# Patient Record
Sex: Female | Born: 1975
Health system: Southern US, Community
[De-identification: ages and names within clinical notes are randomized; demographics above are authoritative.]

## PROBLEM LIST (undated history)

## (undated) DIAGNOSIS — E559 Vitamin D deficiency, unspecified: Secondary | ICD-10-CM

## (undated) DIAGNOSIS — N390 Urinary tract infection, site not specified: Secondary | ICD-10-CM

## (undated) DIAGNOSIS — O039 Complete or unspecified spontaneous abortion without complication: Secondary | ICD-10-CM

## (undated) DIAGNOSIS — D649 Anemia, unspecified: Secondary | ICD-10-CM

## (undated) HISTORY — DX: Urinary tract infection, site not specified: N39.0

## (undated) HISTORY — DX: Complete or unspecified spontaneous abortion without complication: O03.9

## (undated) HISTORY — DX: Anemia, unspecified: D64.9

## (undated) HISTORY — DX: Vitamin D deficiency, unspecified: E55.9

## (undated) HISTORY — PX: TONSILLECTOMY AND ADENOIDECTOMY: SHX28

---

## 2009-04-22 ENCOUNTER — Ambulatory Visit: Payer: Self-pay

## 2009-04-23 ENCOUNTER — Inpatient Hospital Stay: Payer: Self-pay | Admitting: Obstetrics & Gynecology

## 2015-04-15 ENCOUNTER — Emergency Department: Payer: BLUE CROSS/BLUE SHIELD

## 2015-04-15 ENCOUNTER — Emergency Department
Admission: EM | Admit: 2015-04-15 | Discharge: 2015-04-15 | Disposition: A | Discharge status: Home / Self Care | Payer: BLUE CROSS/BLUE SHIELD | Attending: Emergency Medicine | Admitting: Emergency Medicine

## 2015-04-15 ENCOUNTER — Encounter: Payer: Self-pay | Admitting: Emergency Medicine

## 2015-04-15 DIAGNOSIS — Z3A01 Less than 8 weeks gestation of pregnancy: Secondary | ICD-10-CM | POA: Diagnosis not present

## 2015-04-15 DIAGNOSIS — O200 Threatened abortion: Secondary | ICD-10-CM

## 2015-04-15 DIAGNOSIS — Z79899 Other long term (current) drug therapy: Secondary | ICD-10-CM | POA: Insufficient documentation

## 2015-04-15 DIAGNOSIS — O209 Hemorrhage in early pregnancy, unspecified: Secondary | ICD-10-CM | POA: Diagnosis present

## 2015-04-15 LAB — URINALYSIS COMPLETE WITH MICROSCOPIC (ARMC ONLY)
BILIRUBIN URINE: NEGATIVE
Glucose, UA: NEGATIVE mg/dL
Ketones, ur: NEGATIVE mg/dL
Nitrite: NEGATIVE
PROTEIN: 30 mg/dL — AB
Specific Gravity, Urine: 1.015 (ref 1.005–1.030)
pH: 8 (ref 5.0–8.0)

## 2015-04-15 LAB — CBC WITH DIFFERENTIAL/PLATELET
Basophils Absolute: 0.1 10*3/uL (ref 0–0.1)
Basophils Relative: 0 %
Eosinophils Absolute: 0 10*3/uL (ref 0–0.7)
Eosinophils Relative: 0 %
HEMATOCRIT: 37.6 % (ref 35.0–47.0)
HEMOGLOBIN: 12.3 g/dL (ref 12.0–16.0)
LYMPHS ABS: 1.5 10*3/uL (ref 1.0–3.6)
LYMPHS PCT: 12 %
MCH: 28.4 pg (ref 26.0–34.0)
MCHC: 32.8 g/dL (ref 32.0–36.0)
MCV: 86.5 fL (ref 80.0–100.0)
Monocytes Absolute: 0.5 10*3/uL (ref 0.2–0.9)
Monocytes Relative: 4 %
NEUTROS ABS: 10.1 10*3/uL — AB (ref 1.4–6.5)
NEUTROS PCT: 84 %
Platelets: 402 10*3/uL (ref 150–440)
RBC: 4.35 MIL/uL (ref 3.80–5.20)
RDW: 15.3 % — ABNORMAL HIGH (ref 11.5–14.5)
WBC: 12.2 10*3/uL — AB (ref 3.6–11.0)

## 2015-04-15 LAB — COMPREHENSIVE METABOLIC PANEL
ALK PHOS: 45 U/L (ref 38–126)
ALT: 10 U/L — AB (ref 14–54)
AST: 16 U/L (ref 15–41)
Albumin: 4 g/dL (ref 3.5–5.0)
Anion gap: 5 (ref 5–15)
BUN: 7 mg/dL (ref 6–20)
CALCIUM: 9.2 mg/dL (ref 8.9–10.3)
CO2: 28 mmol/L (ref 22–32)
CREATININE: 0.71 mg/dL (ref 0.44–1.00)
Chloride: 108 mmol/L (ref 101–111)
Glucose, Bld: 100 mg/dL — ABNORMAL HIGH (ref 65–99)
Potassium: 3.7 mmol/L (ref 3.5–5.1)
Sodium: 141 mmol/L (ref 135–145)
Total Bilirubin: 0.5 mg/dL (ref 0.3–1.2)
Total Protein: 7.4 g/dL (ref 6.5–8.1)

## 2015-04-15 LAB — ABO/RH: ABO/RH(D): A POS

## 2015-04-15 LAB — HCG, QUANTITATIVE, PREGNANCY: HCG, BETA CHAIN, QUANT, S: 1737 m[IU]/mL — AB (ref <5)

## 2015-04-15 NOTE — ED Notes (Signed)
Pt states she just found out she was pregnant.  LMP 02/25/15.  Today having vaginal bleeding

## 2015-04-15 NOTE — ED Provider Notes (Signed)
[facility] Emergency Department Provider Note  ____________________________________________  Time seen: 1420  I have reviewed the triage vital signs and the nursing notes.   HISTORY  Chief Complaint Vaginal Bleeding  pregnant    HPI Kathy Hicks is a 39 y.o. female, now G2 P1,  whose last menstrual period was in the middle of August. She found out she was pregnant this past Thursday. This past Saturday she began to have vaginal bleeding similar to a menstrual period. She went to go see her gynecologist obstetrician this morning, Dr. Vergie Living at [facility]. Dr. Vergie Living did a pelvic exam but apparently was unable to perform an ultrasound. He sent the patient to the hospital for further evaluation and ultrasound.  The patient reports that she has some mild cramping similar to a menstrual period. She has no discomfort currently. She has no nausea or vomiting. The bleeding is a little bit less today but overall has been similar to her usual menstrual flow.     History reviewed. No pertinent past medical history.  There are no active problems to display for this patient.   History reviewed. No pertinent past surgical history.  Current Outpatient Rx  Name  Route  Sig  Dispense  Refill  . Prenatal Vit-Fe Fumarate-FA (PRENATAL MULTIVITAMIN) TABS tablet   Oral   Take 1 tablet by mouth daily at 12 noon.           Allergies Review of patient's allergies indicates no known allergies.  History reviewed. No pertinent family history.  Social History Social History  Substance Use Topics  . Smoking status: Never Smoker   . Smokeless tobacco: None  . Alcohol Use: None    Review of Systems  Constitutional: Negative for fever. ENT: Negative for sore throat. Cardiovascular: Negative for chest pain. Respiratory: Negative for shortness of breath. Gastrointestinal: Negative for abdominal pain, vomiting and diarrhea. Genitourinary: Vaginal bleeding,  pregnant. See history of present illness. Musculoskeletal: No myalgias or injuries. Skin: Negative for rash. Neurological: Negative for headaches   10-point ROS otherwise negative.  ____________________________________________   PHYSICAL EXAM:  VITAL SIGNS: ED Triage Vitals  Enc Vitals Group     BP 04/15/15 1125 122/62 mmHg     Pulse Rate 04/15/15 1125 94     Resp 04/15/15 1125 18     Temp 04/15/15 1125 98.7 F (37.1 C)     Temp Source 04/15/15 1125 Oral     SpO2 04/15/15 1125 100 %     Weight 04/15/15 1106 145 lb (65.772 kg)     Height 04/15/15 1106  (1.6 m)     Head Cir --      Peak Flow --      Pain Score 04/15/15 1107 2     Pain Loc --      Pain Edu? --      Excl. in GC? --     Constitutional:  Alert and oriented. Well appearing and in no distress. ENT   Head: Normocephalic and atraumatic. Cardiovascular: Normal rate, regular rhythm, no murmur noted Respiratory:  Normal respiratory effort, no tachypnea.    Breath sounds are clear and equal bilaterally.  Gastrointestinal: Soft and nontender. No distention. Pelvic: Deferred as the patient just underwent a pelvic by her gynecologist this morning  Back: No muscle spasm, no tenderness, no CVA tenderness. Musculoskeletal: No deformity noted. Nontender with normal range of motion in all extremities.  No noted edema. Neurologic:  Normal speech and language. No gross focal neurologic  deficits are appreciated.  Skin:  Skin is warm, dry. No rash noted. Psychiatric: Mood and affect are normal. Speech and behavior are normal.  ____________________________________________    LABS (pertinent positives/negatives)  Labs Reviewed  CBC WITH DIFFERENTIAL/PLATELET - Abnormal; Notable for the following:    WBC 12.2 (*)    RDW 15.3 (*)    Neutro Abs 10.1 (*)    All other components within normal limits  COMPREHENSIVE METABOLIC PANEL - Abnormal; Notable for the following:    Glucose, Bld 100 (*)    ALT 10 (*)    All  other components within normal limits  HCG, QUANTITATIVE, PREGNANCY - Abnormal; Notable for the following:    hCG, Beta Chain, Quant, S 1737 (*)    All other components within normal limits  URINALYSIS COMPLETEWITH MICROSCOPIC ([facility] ONLY) - Abnormal; Notable for the following:    Color, Urine YELLOW (*)    APPearance HAZY (*)    Hgb urine dipstick 3+ (*)    Protein, ur 30 (*)    Leukocytes, UA 1+ (*)    Bacteria, UA RARE (*)    Squamous Epithelial / LPF 0-5 (*)    All other components within normal limits  ABO/RH     ____________________________________________    RADIOLOGY  Pelvic ultrasound: IMPRESSION: 1. Sac-like collection within the uterus, along the endometrial complex, most suggestive of a gestational sac of early intrauterine pregnancy. By sac size, estimated gestational age is 5 weeks and 4 days. No yolk sac or fetal pole identified as yet. Recommend follow-up with serial beta HCG levels and obstetric ultrasound as needed to ensure normal pregnancy. 2. 4 cm hypoechoic mass along the posterior margin of the uterus, most suggestive of an exophytic fibroid. 3. Maternal ovaries appear normal and there is no mass or free fluid identified within either adnexal region.  ____________________________________________   INITIAL IMPRESSION / ASSESSMENT AND PLAN / ED COURSE  Pertinent labs & imaging results that were available during my care of the patient were reviewed by me and considered in my medical decision making (see chart for details).   Well-appearing, pleasant, 39 year old female, accompanied by her husband. We have spoken about the differential diagnosis of her current condition, which includes threatened miscarriage versus continuing regnancy. We will obtain an ultrasound. I have added on a type and Rh factor.  ----------------------------------------- 4:53 PM on 04/15/2015 -----------------------------------------  Ultrasound shows a saclike collection  consistent with a gestational age of [redacted] weeks and 4 days.  Blood type is A-positive  The patient appears comfortable and in no acute distress.  We will discharge the patient to follow up with Dr. Vergie Living at [facility] side OB[facility]. ____________________________________________   FINAL CLINICAL IMPRESSION(S) / ED DIAGNOSES  Final diagnoses:  Threatened miscarriage in early pregnancy      Darien Ramus, MD 04/15/15 1655

## 2015-04-15 NOTE — Discharge Instructions (Signed)

## 2016-04-24 DIAGNOSIS — R8290 Unspecified abnormal findings in urine: Secondary | ICD-10-CM | POA: Diagnosis not present

## 2016-12-14 ENCOUNTER — Telehealth: Payer: Self-pay

## 2016-12-14 NOTE — Telephone Encounter (Signed)
Pt calling - has forgotten what dose of Vit D she is supposed to take.  Is 1000 IU or 2000 IU?  [phone]

## 2016-12-14 NOTE — Telephone Encounter (Signed)
Vitamin D3 2000 IU total daily. RN to notify pt.

## 2016-12-14 NOTE — Telephone Encounter (Signed)
Pt aware.

## 2017-05-17 ENCOUNTER — Telehealth: Payer: Self-pay

## 2017-05-17 NOTE — Telephone Encounter (Signed)
Pt calling, thinks she has a UTI, has rx for UTI that she didn't take a year ago b/c what she had was a yeast inf.  Wants to know if that is okay to take.  [phone]  Pt's sxs are frequency and strong odor.  Adv to go ahead and take medication and if it doesn't help to call to be seen.

## 2017-10-04 ENCOUNTER — Ambulatory Visit: Payer: Self-pay | Admitting: Obstetrics and Gynecology

## 2017-10-18 ENCOUNTER — Ambulatory Visit (INDEPENDENT_AMBULATORY_CARE_PROVIDER_SITE_OTHER): Payer: BLUE CROSS/BLUE SHIELD | Admitting: Obstetrics and Gynecology

## 2017-10-18 ENCOUNTER — Other Ambulatory Visit: Payer: Self-pay

## 2017-10-18 ENCOUNTER — Encounter: Payer: Self-pay | Admitting: Obstetrics and Gynecology

## 2017-10-18 VITALS — BP 116/76 | HR 80 | Ht 61.0 in | Wt 131.0 lb

## 2017-10-18 DIAGNOSIS — Z1151 Encounter for screening for human papillomavirus (HPV): Secondary | ICD-10-CM | POA: Diagnosis not present

## 2017-10-18 DIAGNOSIS — Z124 Encounter for screening for malignant neoplasm of cervix: Secondary | ICD-10-CM | POA: Diagnosis not present

## 2017-10-18 DIAGNOSIS — Z01419 Encounter for gynecological examination (general) (routine) without abnormal findings: Secondary | ICD-10-CM | POA: Diagnosis not present

## 2017-10-18 DIAGNOSIS — N841 Polyp of cervix uteri: Secondary | ICD-10-CM | POA: Insufficient documentation

## 2017-10-18 DIAGNOSIS — Z760 Encounter for issue of repeat prescription: Secondary | ICD-10-CM

## 2017-10-18 DIAGNOSIS — Z1231 Encounter for screening mammogram for malignant neoplasm of breast: Secondary | ICD-10-CM | POA: Diagnosis not present

## 2017-10-18 DIAGNOSIS — N390 Urinary tract infection, site not specified: Secondary | ICD-10-CM | POA: Insufficient documentation

## 2017-10-18 DIAGNOSIS — Z1239 Encounter for other screening for malignant neoplasm of breast: Secondary | ICD-10-CM

## 2017-10-18 MED ORDER — NITROFURANTOIN MONOHYD MACRO 100 MG PO CAPS
ORAL_CAPSULE | ORAL | 1 refills | Status: DC
Start: 2017-10-18 — End: 2019-04-05

## 2017-10-18 NOTE — Progress Notes (Signed)
PCP:  Patient, No Pcp Per   Chief Complaint  Patient presents with  . Gynecologic Exam    No complaints     HPI:      [name] is a 42 y.o. G1P0 who LMP was Patient's last menstrual period was 10/02/2017., presents today for her annual examination.  Her menses are regular every 19-28 days, lasting 6 days.  Dysmenorrhea mild, occurring first 1-2 days of flow. She does not have intermenstrual bleeding.  Sex activity: single partner, contraception - none. Conception ok. Had SAB 2016. No postcoital bleeding. Last Pap: August 15, 2013  Results were: no abnormalities /neg HPV DNA  Hx of STDs: none  Last mammogram: not within yr. There is no FH of breast cancer. There is no FH of ovarian cancer. The patient does do self-breast exams.  Tobacco use: The patient denies current or previous tobacco use. Alcohol use: none No drug use.  Exercise: moderately active  She does get adequate calcium and Vitamin D in her diet. She takes macrobid BID x 7 days prn UTI sx, triggered by sex. Rx had been written to take once after sex as preventive. Needs Rx RF.  Past Medical History:  Diagnosis Date  . Anemia   . SAB (spontaneous abortion)   . UTI (urinary tract infection)   . Vitamin D deficiency     Past Surgical History:  Procedure Laterality Date  . NO PAST SURGERIES      Family History  Problem Relation Age of Onset  . Uterine cancer Mother 62  . Hypertension Mother   . Hyperlipidemia Mother   . Lung cancer Father 34    Social History   Socioeconomic History  . Marital status: Married    Spouse name: Not on file  . Number of children: Not on file  . Years of education: Not on file  . Highest education level: Not on file  Occupational History  . Not on file  Social Needs  . Financial resource strain: Not on file  . Food insecurity:    Worry: Not on file    Inability: Not on file  . Transportation needs:    Medical: Not on file    Non-medical: Not on file   Tobacco Use  . Smoking status: Never Smoker  . Smokeless tobacco: Never Used  Substance and Sexual Activity  . Alcohol use: Not Currently  . Drug use: Never  . Sexual activity: Yes    Birth control/protection: None  Lifestyle  . Physical activity:    Days per week: 7 days    Minutes per session: 30 min  . Stress: Not on file  Relationships  . Social connections:    Talks on phone: Not on file    Gets together: Not on file    Attends religious service: Not on file    Active member of club or organization: Not on file    Attends meetings of clubs or organizations: Not on file    Relationship status: Not on file  . Intimate partner violence:    Fear of current or ex partner: Not on file    Emotionally abused: Not on file    Physically abused: Not on file    Forced sexual activity: Not on file  Other Topics Concern  . Not on file  Social History Narrative  . Not on file    Outpatient Medications Prior to Visit  Medication Sig Dispense Refill  . Prenatal Vit-Fe Fumarate-FA (PRENATAL MULTIVITAMIN)  TABS tablet Take 1 tablet by mouth daily at 12 noon.     No facility-administered medications prior to visit.      ROS:  Review of Systems  Constitutional: Negative for fatigue, fever and unexpected weight change.  Respiratory: Negative for cough, shortness of breath and wheezing.   Cardiovascular: Negative for chest pain, palpitations and leg swelling.  Gastrointestinal: Negative for blood in stool, constipation, diarrhea, nausea and vomiting.  Endocrine: Negative for cold intolerance, heat intolerance and polyuria.  Genitourinary: Negative for dyspareunia, dysuria, flank pain, frequency, genital sores, hematuria, menstrual problem, pelvic pain, urgency, vaginal bleeding, vaginal discharge and vaginal pain.  Musculoskeletal: Negative for back pain, joint swelling and myalgias.  Skin: Negative for rash.  Neurological: Negative for dizziness, syncope, light-headedness, numbness  and headaches.  Hematological: Negative for adenopathy.  Psychiatric/Behavioral: Negative for agitation, confusion, sleep disturbance and suicidal ideas. The patient is not nervous/anxious.    BREAST: No symptoms   Objective: BP 116/76 (BP Location: Left Arm, Patient Position: Sitting, Cuff Size: Normal)   Pulse 80   Ht 5\' 1"  (1.549 m)   Wt 131 lb (59.4 kg)   LMP 10/02/2017   Breastfeeding? Unknown   BMI 24.75 kg/m    Physical Exam  Constitutional: She is oriented to person, place, and time. She appears well-developed and well-nourished.  Genitourinary: Vagina normal and uterus normal. There is no rash or tenderness on the right labia. There is no rash or tenderness on the left labia. No erythema or tenderness in the vagina. No vaginal discharge found. Right adnexum does not display mass and does not display tenderness. Left adnexum does not display mass and does not display tenderness.  Cervix exhibits polyp. Cervix does not exhibit motion tenderness. Uterus is not enlarged or tender.  Neck: Normal range of motion. No thyromegaly present.  Cardiovascular: Normal rate, regular rhythm and normal heart sounds.  No murmur heard. Pulmonary/Chest: Effort normal and breath sounds normal. Right breast exhibits no mass, no nipple discharge, no skin change and no tenderness. Left breast exhibits no mass, no nipple discharge, no skin change and no tenderness.  Abdominal: Soft. There is no tenderness. There is no guarding.  Musculoskeletal: Normal range of motion.  Neurological: She is alert and oriented to person, place, and time. No cranial nerve deficit.  Psychiatric: She has a normal mood and affect. Her behavior is normal.  Vitals reviewed.    ENDOCX POLYP REMOVAL Cervix visualized and polyp noted.  Ring forcep applied to cervix and twisting motion removed polyp intact.  Hemostasis obtained.  Assessment/Plan: Encounter for annual routine gynecological examination  Cervical cancer  screening - Plan: IGP, Aptima HPV  Screening for HPV (human papillomavirus) - Plan: IGP, Aptima HPV  Screening for breast cancer - Pt to sched mammo. - Plan: MM DIGITAL SCREENING BILATERAL  Endocervical polyp - Removed, sent to path. Will f/u if abn. Pt without sx. - Plan: Pathology  Prescription refill - Rx RF macrobid to take after sex as preventive.  - Plan: nitrofurantoin, macrocrystal-monohydrate, (MACROBID) 100 MG capsule  Meds ordered this encounter  Medications  . nitrofurantoin, macrocrystal-monohydrate, (MACROBID) 100 MG capsule    Sig: Take 1 capsule after sex as preventive    Dispense:  30 capsule    Refill:  1    Order Specific Question:   Supervising Provider    Answer:   Nadara Mustard [[id number]]             GYN counsel breast self exam, mammography screening, adequate  intake of calcium and vitamin D, diet and exercise     F/U  Return in about 1 year (around 10/19/2018).  Sherria Riemann B. Nyriah Coote, PA-C 10/18/2017 10:47 AM

## 2017-10-18 NOTE — Patient Instructions (Addendum)
I value your feedback and entrusting us with your care. If you get a [facility] patient survey, I would appreciate you taking the time to let us know about your experience today. Thank you!  [facility]: [phone]   [facility]: [phone]  

## 2017-10-20 LAB — PATHOLOGY

## 2017-10-21 LAB — IGP, APTIMA HPV
HPV APTIMA: NEGATIVE
PAP Smear Comment: 0

## 2017-12-13 DIAGNOSIS — J0140 Acute pansinusitis, unspecified: Secondary | ICD-10-CM | POA: Diagnosis not present

## 2017-12-13 DIAGNOSIS — J209 Acute bronchitis, unspecified: Secondary | ICD-10-CM | POA: Diagnosis not present

## 2018-05-24 ENCOUNTER — Other Ambulatory Visit: Payer: Self-pay | Admitting: Obstetrics and Gynecology

## 2018-05-24 DIAGNOSIS — Z1231 Encounter for screening mammogram for malignant neoplasm of breast: Secondary | ICD-10-CM

## 2018-06-01 ENCOUNTER — Ambulatory Visit
Admission: RE | Admit: 2018-06-01 | Discharge: 2018-06-01 | Disposition: A | Discharge status: Home / Self Care | Payer: BLUE CROSS/BLUE SHIELD | Source: Ambulatory Visit | Attending: Obstetrics and Gynecology | Admitting: Obstetrics and Gynecology

## 2018-06-01 ENCOUNTER — Encounter: Payer: Self-pay | Admitting: Obstetrics and Gynecology

## 2018-06-01 DIAGNOSIS — Z1231 Encounter for screening mammogram for malignant neoplasm of breast: Secondary | ICD-10-CM | POA: Diagnosis not present

## 2018-07-22 ENCOUNTER — Ambulatory Visit: Payer: BLUE CROSS/BLUE SHIELD | Admitting: Maternal Newborn

## 2019-03-09 ENCOUNTER — Telehealth: Payer: Self-pay

## 2019-03-09 DIAGNOSIS — Z0000 Encounter for general adult medical examination without abnormal findings: Secondary | ICD-10-CM

## 2019-03-09 DIAGNOSIS — Z13220 Encounter for screening for lipoid disorders: Secondary | ICD-10-CM

## 2019-03-09 NOTE — Telephone Encounter (Signed)
Pt calling; needs to schedule appt for labs; eye doctor saw cholesterol buildup on her eyes 86m ago.  She sched her annual for 04/05/19 at 3pm.  Can labs be done that morning?   She knows she will need to fast the night before.  [phone]

## 2019-03-09 NOTE — Telephone Encounter (Signed)
Patient is schedule for 03/30/19 for LABS

## 2019-03-09 NOTE — Telephone Encounter (Signed)
Lab orders placed. Pls call pt to sched lab appt. She can do as early as tomorrow if she doesn't want to wait for annual appt. Thx.

## 2019-03-09 NOTE — Telephone Encounter (Signed)
Please advise 

## 2019-03-30 ENCOUNTER — Other Ambulatory Visit: Payer: Self-pay

## 2019-03-30 ENCOUNTER — Other Ambulatory Visit: Payer: BC Managed Care – PPO

## 2019-03-30 DIAGNOSIS — Z13220 Encounter for screening for lipoid disorders: Secondary | ICD-10-CM | POA: Diagnosis not present

## 2019-03-30 DIAGNOSIS — Z0000 Encounter for general adult medical examination without abnormal findings: Secondary | ICD-10-CM

## 2019-03-31 LAB — COMPREHENSIVE METABOLIC PANEL
ALT: 7 IU/L (ref 0–32)
AST: 12 IU/L (ref 0–40)
Albumin/Globulin Ratio: 2 (ref 1.2–2.2)
Albumin: 4.3 g/dL (ref 3.8–4.8)
Alkaline Phosphatase: 48 IU/L (ref 39–117)
BUN/Creatinine Ratio: 18 (ref 9–23)
BUN: 14 mg/dL (ref 6–24)
Bilirubin Total: <0.2 mg/dL (ref 0.0–1.2)
CO2: 24 mmol/L (ref 20–29)
Calcium: 9.5 mg/dL (ref 8.7–10.2)
Chloride: 103 mmol/L (ref 96–106)
Creatinine, Ser: 0.78 mg/dL (ref 0.57–1.00)
GFR calc Af Amer: 107 mL/min/{1.73_m2} (ref >59)
GFR calc non Af Amer: 93 mL/min/{1.73_m2} (ref >59)
Globulin, Total: 2.2 g/dL (ref 1.5–4.5)
Glucose: 81 mg/dL (ref 65–99)
Potassium: 4.2 mmol/L (ref 3.5–5.2)
Sodium: 141 mmol/L (ref 134–144)
Total Protein: 6.5 g/dL (ref 6.0–8.5)

## 2019-03-31 LAB — LIPID PANEL
Chol/HDL Ratio: 2.5 ratio (ref 0.0–4.4)
Cholesterol, Total: 189 mg/dL (ref 100–199)
HDL: 76 mg/dL (ref >39)
LDL Chol Calc (NIH): 104 mg/dL — ABNORMAL HIGH (ref 0–99)
Triglycerides: 47 mg/dL (ref 0–149)
VLDL Cholesterol Cal: 9 mg/dL (ref 5–40)

## 2019-03-31 NOTE — Progress Notes (Signed)
Pls let pt know lab results and lipid values. All normal. Thx.

## 2019-03-31 NOTE — Progress Notes (Signed)
Called pt, no answer, left detailed msg

## 2019-04-05 ENCOUNTER — Other Ambulatory Visit: Payer: Self-pay

## 2019-04-05 ENCOUNTER — Ambulatory Visit (INDEPENDENT_AMBULATORY_CARE_PROVIDER_SITE_OTHER): Payer: BC Managed Care – PPO | Admitting: Obstetrics and Gynecology

## 2019-04-05 ENCOUNTER — Encounter: Payer: Self-pay | Admitting: Obstetrics and Gynecology

## 2019-04-05 VITALS — BP 100/70 | Ht 62.0 in | Wt 146.0 lb

## 2019-04-05 DIAGNOSIS — Z01419 Encounter for gynecological examination (general) (routine) without abnormal findings: Secondary | ICD-10-CM

## 2019-04-05 DIAGNOSIS — N979 Female infertility, unspecified: Secondary | ICD-10-CM

## 2019-04-05 DIAGNOSIS — Z1239 Encounter for other screening for malignant neoplasm of breast: Secondary | ICD-10-CM

## 2019-04-05 DIAGNOSIS — Z760 Encounter for issue of repeat prescription: Secondary | ICD-10-CM

## 2019-04-05 MED ORDER — NITROFURANTOIN MONOHYD MACRO 100 MG PO CAPS: ORAL_CAPSULE | ORAL | 1 refills | Status: DC

## 2019-04-05 NOTE — Patient Instructions (Signed)
I value your feedback and entrusting us with your care. If you get a [facility] patient survey, I would appreciate you taking the time to let us know about your experience today. Thank you! 

## 2019-04-05 NOTE — Progress Notes (Signed)
PCP:  Patient, No Pcp Per   Chief Complaint  Patient presents with  . Gynecologic Exam     HPI:      [name] is a 43 y.o. G1P0 who LMP was Patient's last menstrual period was 03/17/2019 (exact date)., presents today for her annual examination.  Her menses are regular every month, lasting 5 days.  Dysmenorrhea mild, occurring first 1-2 days of flow. She does not have intermenstrual bleeding.  Sex activity: single partner, contraception - none. Conception ok--would like ref to REI. Had SAB 2016, no conception since. Last Pap: 10/18/17 Results were: no abnormalities /neg HPV DNA  Hx of STDs: none Endocervical polyp removed 4/19 with neg path. No bleeding with sex.  Last mammogram: 06/01/18 Results: normal, repeat in 12 months. There is no FH of breast cancer. There is no FH of ovarian cancer. The patient does do self-breast exams.  Tobacco use: The patient denies current or previous tobacco use. Alcohol use: none No drug use.  Exercise: moderately active  She does get adequate calcium and Vitamin D in her diet. She takes macrobid once after sex as preventive for UTIs with sx control. Needs Rx RF.  Recent normal lipids/labs 9/20  Past Medical History:  Diagnosis Date  . Anemia   . SAB (spontaneous abortion)   . UTI (urinary tract infection)   . Vitamin D deficiency     Past Surgical History:  Procedure Laterality Date  . TONSILLECTOMY AND ADENOIDECTOMY      Family History  Problem Relation Age of Onset  . Uterine cancer Mother 50  . Hypertension Mother   . Hyperlipidemia Mother   . Lung cancer Father 89    Social History   Socioeconomic History  . Marital status: Married    Spouse name: Not on file  . Number of children: Not on file  . Years of education: Not on file  . Highest education level: Not on file  Occupational History  . Not on file  Social Needs  . Financial resource strain: Not on file  . Food insecurity    Worry: Not on file   Inability: Not on file  . Transportation needs    Medical: Not on file    Non-medical: Not on file  Tobacco Use  . Smoking status: Never Smoker  . Smokeless tobacco: Never Used  Substance and Sexual Activity  . Alcohol use: Not Currently  . Drug use: Never  . Sexual activity: Yes    Birth control/protection: None  Lifestyle  . Physical activity    Days per week: 7 days    Minutes per session: 30 min  . Stress: Not on file  Relationships  . Social Musician on phone: Not on file    Gets together: Not on file    Attends religious service: Not on file    Active member of club or organization: Not on file    Attends meetings of clubs or organizations: Not on file    Relationship status: Not on file  . Intimate partner violence    Fear of current or ex partner: Not on file    Emotionally abused: Not on file    Physically abused: Not on file    Forced sexual activity: Not on file  Other Topics Concern  . Not on file  Social History Narrative  . Not on file    Outpatient Medications Prior to Visit  Medication Sig Dispense Refill  . nitrofurantoin,  macrocrystal-monohydrate, (MACROBID) 100 MG capsule Take 1 capsule after sex as preventive 30 capsule 1  . Prenatal Vit-Fe Fumarate-FA (PRENATAL MULTIVITAMIN) TABS tablet Take 1 tablet by mouth daily at 12 noon.     No facility-administered medications prior to visit.      ROS:  Review of Systems  Constitutional: Negative for fatigue, fever and unexpected weight change.  Respiratory: Negative for cough, shortness of breath and wheezing.   Cardiovascular: Negative for chest pain, palpitations and leg swelling.  Gastrointestinal: Negative for blood in stool, constipation, diarrhea, nausea and vomiting.  Endocrine: Negative for cold intolerance, heat intolerance and polyuria.  Genitourinary: Negative for dyspareunia, dysuria, flank pain, frequency, genital sores, hematuria, menstrual problem, pelvic pain, urgency,  vaginal bleeding, vaginal discharge and vaginal pain.  Musculoskeletal: Negative for back pain, joint swelling and myalgias.  Skin: Negative for rash.  Neurological: Negative for dizziness, syncope, light-headedness, numbness and headaches.  Hematological: Negative for adenopathy.  Psychiatric/Behavioral: Negative for agitation, confusion, sleep disturbance and suicidal ideas. The patient is not nervous/anxious.    BREAST: No symptoms   Objective: BP 100/70   Ht 5\' 2"  (1.575 m)   Wt 146 lb (66.2 kg)   LMP 03/17/2019 (Exact Date)   Breastfeeding No   BMI 26.70 kg/m    Physical Exam Constitutional:      Appearance: She is well-developed.  Genitourinary:     Vulva, vagina, uterus, right adnexa and left adnexa normal.     No vulval lesion or tenderness noted.     No vaginal discharge, erythema or tenderness.     Cervical polyp present.     No cervical motion tenderness.     Uterus is not enlarged or tender.     No right or left adnexal mass present.     Right adnexa not tender.     Left adnexa not tender.  Neck:     Musculoskeletal: Normal range of motion.     Thyroid: No thyromegaly.  Cardiovascular:     Rate and Rhythm: Normal rate and regular rhythm.     Heart sounds: Normal heart sounds. No murmur.  Pulmonary:     Effort: Pulmonary effort is normal.     Breath sounds: Normal breath sounds.  Chest:     Breasts:        Right: No mass, nipple discharge, skin change or tenderness.        Left: No mass, nipple discharge, skin change or tenderness.  Abdominal:     Palpations: Abdomen is soft.     Tenderness: There is no abdominal tenderness. There is no guarding.  Musculoskeletal: Normal range of motion.  Neurological:     General: No focal deficit present.     Mental Status: She is alert and oriented to person, place, and time.     Cranial Nerves: No cranial nerve deficit.  Skin:    General: Skin is warm and dry.  Psychiatric:        Mood and Affect: Mood normal.         Behavior: Behavior normal.        Thought Content: Thought content normal.        Judgment: Judgment normal.  Vitals signs reviewed.      Assessment/Plan: Encounter for annual routine gynecological examination  Screening for breast cancer - Plan: MM 3D SCREEN BREAST BILATERAL--pt to sched mammo  Infertility, female - Plan: Ambulatory referral to Infertility; refer to [facility] for further eval/mgmt.  Prescription refill - Rx RF macrobid to  take after sex as preventive.  - Plan: nitrofurantoin, macrocrystal-monohydrate, (MACROBID) 100 MG capsule  Meds ordered this encounter  Medications  . nitrofurantoin, macrocrystal-monohydrate, (MACROBID) 100 MG capsule    Sig: Take 1 capsule after sex as preventive    Dispense:  30 capsule    Refill:  1    Order Specific Question:   Supervising Provider    Answer:   Gae Dry [[id number]]             GYN counsel breast self exam, mammography screening, adequate intake of calcium and vitamin D, diet and exercise     F/U  Return in about 1 year (around 04/04/2020).  Alicia B. Copland, PA-C 04/05/2019 4:05 PM

## 2019-04-28 ENCOUNTER — Ambulatory Visit: Payer: BC Managed Care – PPO | Admitting: Advanced Practice Midwife

## 2019-06-23 ENCOUNTER — Other Ambulatory Visit: Payer: Self-pay

## 2019-06-23 DIAGNOSIS — Z20822 Contact with and (suspected) exposure to covid-19: Secondary | ICD-10-CM

## 2019-06-25 LAB — NOVEL CORONAVIRUS, NAA: SARS-CoV-2, NAA: NOT DETECTED

## 2019-11-11 ENCOUNTER — Other Ambulatory Visit: Payer: Self-pay

## 2019-11-11 ENCOUNTER — Ambulatory Visit: Payer: Self-pay | Attending: Internal Medicine

## 2019-11-11 DIAGNOSIS — Z23 Encounter for immunization: Secondary | ICD-10-CM

## 2019-11-11 NOTE — Progress Notes (Signed)
   Covid-19 Vaccination Clinic  Name:  Kathy Hicks    MRN: [id number] DOB: January 14, 1976  11/11/2019  [name] was observed post Covid-19 immunization for 15 minutes without incident. She was provided with Vaccine Information Sheet and instruction to access the V-Safe system.   [name] was instructed to call 911 with any severe reactions post vaccine: Marland Kitchen Difficulty breathing  . Swelling of face and throat  . A fast heartbeat  . A bad rash all over body  . Dizziness and weakness   Immunizations Administered    Name Date Dose VIS Date Route   Pfizer COVID-19 Vaccine 11/11/2019  8:45 AM 0.3 mL 09/06/2018 Intramuscular   Manufacturer: [employer], [employer]   Lot: [id number]   NDC: [address]-0

## 2019-12-05 ENCOUNTER — Ambulatory Visit: Payer: Self-pay | Attending: Internal Medicine

## 2019-12-05 DIAGNOSIS — Z23 Encounter for immunization: Secondary | ICD-10-CM

## 2019-12-05 NOTE — Progress Notes (Signed)
   Covid-19 Vaccination Clinic  Name:  TRACIA LACOMB    MRN: [id number] DOB: [birth year]  12/05/2019  Ms. Schmiesing was observed post Covid-19 immunization for 15 minutes without incident. She was provided with Vaccine Information Sheet and instruction to access the V-Safe system.   Ms. Needles was instructed to call 911 with any severe reactions post vaccine: Marland Kitchen Difficulty breathing  . Swelling of face and throat  . A fast heartbeat  . A bad rash all over body  . Dizziness and weakness   Immunizations Administered    Name Date Dose VIS Date Route   Pfizer COVID-19 Vaccine 12/05/2019  3:41 PM 0.3 mL 09/06/2018 Intramuscular   Manufacturer: [employer], [employer]   Lot: [id number]   NDC: [address]-7

## 2020-05-19 ENCOUNTER — Other Ambulatory Visit: Payer: Self-pay | Admitting: Obstetrics and Gynecology

## 2020-05-19 DIAGNOSIS — Z760 Encounter for issue of repeat prescription: Secondary | ICD-10-CM

## 2020-05-20 ENCOUNTER — Other Ambulatory Visit: Payer: Self-pay | Admitting: Obstetrics and Gynecology

## 2020-05-20 ENCOUNTER — Telehealth: Payer: Self-pay

## 2020-05-20 DIAGNOSIS — Z760 Encounter for issue of repeat prescription: Secondary | ICD-10-CM

## 2020-05-20 MED ORDER — NITROFURANTOIN MONOHYD MACRO 100 MG PO CAPS: ORAL_CAPSULE | ORAL | 0 refills | Status: DC

## 2020-05-20 NOTE — Telephone Encounter (Signed)
Pt aware.

## 2020-05-20 NOTE — Telephone Encounter (Signed)
Please see

## 2020-05-20 NOTE — Telephone Encounter (Signed)
Pt calling triage needing a refill on her uti meds, said her rx expired on 9/23.

## 2020-05-20 NOTE — Telephone Encounter (Signed)
Patient is scheduled for 07/03/20 at 4:10 for annual. Patient would like to be called if refill would be sent in

## 2020-05-20 NOTE — Telephone Encounter (Signed)
Rx RF eRxd. Pls notify pt. Thx 

## 2020-05-20 NOTE — Telephone Encounter (Signed)
Pt needs is past due for her annual. Once schedule pls forward msg to me.

## 2020-05-20 NOTE — Progress Notes (Signed)
Rx RF macrobid till 12/21 annual

## 2020-05-28 ENCOUNTER — Telehealth: Payer: Self-pay

## 2020-05-28 NOTE — Telephone Encounter (Signed)
Pls sched pt with RN at 4:00 on Wed/tomorrow for urine check. She is aware. Thx.

## 2020-05-28 NOTE — Telephone Encounter (Signed)
Spoke with pt. Will do urine drop off with nurse at 4:00 tomorrow. Will then do C&S since still with sx.  Discussed late/missed periods and perimenopause. F/u prn DUB.

## 2020-05-28 NOTE — Telephone Encounter (Signed)
Pt would like a call from [facility] she took her Nitrofurantoin, from the 7th -11th she is still having symptoms of a UTI. Also has not had a period but took a pregnancy test and it was negative. Her period last mont came on the 1st, Please advise

## 2020-05-28 NOTE — Telephone Encounter (Signed)
Please advise 

## 2020-05-29 ENCOUNTER — Other Ambulatory Visit: Payer: Self-pay

## 2020-05-29 ENCOUNTER — Ambulatory Visit (INDEPENDENT_AMBULATORY_CARE_PROVIDER_SITE_OTHER): Payer: Self-pay

## 2020-05-29 DIAGNOSIS — R399 Unspecified symptoms and signs involving the genitourinary system: Secondary | ICD-10-CM

## 2020-05-29 LAB — POCT URINALYSIS DIPSTICK
Bilirubin, UA: NEGATIVE
Glucose, UA: NEGATIVE
Ketones, UA: NEGATIVE
Leukocytes, UA: NEGATIVE
Nitrite, UA: NEGATIVE
Protein, UA: POSITIVE — AB
pH, UA: >=9 — AB (ref 5.0–8.0)

## 2020-05-29 NOTE — Telephone Encounter (Signed)
Patient scheduled nurse visit for 05/29/20 at 4.

## 2020-05-30 ENCOUNTER — Telehealth: Payer: Self-pay

## 2020-05-30 DIAGNOSIS — R399 Unspecified symptoms and signs involving the genitourinary system: Secondary | ICD-10-CM | POA: Diagnosis not present

## 2020-05-30 NOTE — Telephone Encounter (Signed)
No, she only has blood in her urine, not a confirmed UTI, which is why we're checking the culture. She already failed the nitrofurantoin so no use in taking it again until we have the C&S results. She may need a different abx. Thx.

## 2020-05-30 NOTE — Telephone Encounter (Signed)
Pt aware.

## 2020-05-30 NOTE — Telephone Encounter (Signed)
Pt calling; saw in MyChart she has a UTI; has generic macrobid; should she start it for 5d or what to do?; can't stand the strong urine smell.  Wants to talk c [facility].  [phone]

## 2020-06-03 ENCOUNTER — Telehealth: Payer: Self-pay

## 2020-06-03 DIAGNOSIS — H6063 Unspecified chronic otitis externa, bilateral: Secondary | ICD-10-CM | POA: Diagnosis not present

## 2020-06-03 DIAGNOSIS — K219 Gastro-esophageal reflux disease without esophagitis: Secondary | ICD-10-CM | POA: Diagnosis not present

## 2020-06-03 DIAGNOSIS — J301 Allergic rhinitis due to pollen: Secondary | ICD-10-CM | POA: Diagnosis not present

## 2020-06-03 NOTE — Telephone Encounter (Signed)
Pt called about her UC results. Pt aware results are not back yet and we will let her know once they are back. Should be back tomorrow, checked with the lab

## 2020-06-04 NOTE — Telephone Encounter (Signed)
Sandy check urine culture results, still no final report. Should be finalized tomorrow!

## 2020-06-05 NOTE — Telephone Encounter (Signed)
Pt calling; was told earlier today we would give her an update later today; pt calling b/c it's 3:00pm and she hasn't recv'd update and [facility] is closed tomorrow if she needs rx; she has taken macrobid 11/7-11th; and on the 16th which was the day before her sample was given.  She has rx for macrobid which she will take if she doesn't hear from [place] as it did help with the odor of her urine.  She cannot stand that strong smell.  [phone]  Pt aware culture still not back.  Pt wanted it noted that she is going to take the macrobid until she hears from the culture as it helps with the odor.  Pt states no other sxs.

## 2020-06-05 NOTE — Telephone Encounter (Signed)
Called pt to let her know we still don't have results for urine culture sent last week. Per Victorino Dike we are still in the 5-7 time frame for results. Pt understands. Says she still has the strong odor, denies any burning or frequency urinating, pelvic or back pain.

## 2020-06-05 NOTE — Telephone Encounter (Addendum)
MyChart msg sent to pt 11/25. C&S results still not back. Pt on macrobid--only symtpom is odor.

## 2020-06-06 ENCOUNTER — Encounter: Payer: Self-pay | Admitting: Obstetrics and Gynecology

## 2020-06-07 LAB — URINE CULTURE

## 2020-06-07 MED ORDER — SULFAMETHOXAZOLE-TRIMETHOPRIM 800-160 MG PO TABS: 1.0000 | ORAL_TABLET | Freq: Two times a day (BID) | ORAL | 0 refills | Status: DC

## 2020-06-07 NOTE — Addendum Note (Signed)
Addended by: Althea Grimmer B on: 06/07/2020 04:55 PM   Modules accepted: Orders

## 2020-07-03 ENCOUNTER — Encounter: Payer: Self-pay | Admitting: Obstetrics and Gynecology

## 2020-07-03 ENCOUNTER — Ambulatory Visit: Payer: BC Managed Care – PPO | Admitting: Obstetrics and Gynecology

## 2020-07-03 ENCOUNTER — Other Ambulatory Visit: Payer: Self-pay

## 2020-07-03 ENCOUNTER — Ambulatory Visit (INDEPENDENT_AMBULATORY_CARE_PROVIDER_SITE_OTHER): Payer: BC Managed Care – PPO | Admitting: Obstetrics and Gynecology

## 2020-07-03 VITALS — BP 100/70 | Ht 62.0 in | Wt 149.0 lb

## 2020-07-03 DIAGNOSIS — Z1211 Encounter for screening for malignant neoplasm of colon: Secondary | ICD-10-CM | POA: Diagnosis not present

## 2020-07-03 DIAGNOSIS — N390 Urinary tract infection, site not specified: Secondary | ICD-10-CM | POA: Diagnosis not present

## 2020-07-03 DIAGNOSIS — Z01419 Encounter for gynecological examination (general) (routine) without abnormal findings: Secondary | ICD-10-CM | POA: Diagnosis not present

## 2020-07-03 DIAGNOSIS — Z1231 Encounter for screening mammogram for malignant neoplasm of breast: Secondary | ICD-10-CM | POA: Diagnosis not present

## 2020-07-03 MED ORDER — NITROFURANTOIN MONOHYD MACRO 100 MG PO CAPS: ORAL_CAPSULE | ORAL | 2 refills | Status: DC

## 2020-07-03 NOTE — Patient Instructions (Addendum)
I value your feedback and entrusting us with your care. If you get a [facility] patient survey, I would appreciate you taking the time to let us know about your experience today. Thank you! ° °As of June 22, 2019, your lab results will be released to your MyChart immediately, before I even have a chance to see them. Please give me time to review them and contact you if there are any abnormalities. Thank you for your patience.  ° °[facility]: [phone] ° ° ° °

## 2020-07-03 NOTE — Progress Notes (Signed)
PCP:  Patient, No Pcp Per   Chief Complaint  Patient presents with  . Gynecologic Exam    No concerns     HPI:      [name] is a 44 y.o. G1P0 who LMP was Patient's last menstrual period was 06/10/2020 (approximate)., presents today for her annual examination.  Her menses are regular every month, lasting 5 days.  Dysmenorrhea mild, occurring first 1-2 days of flow. She does not have intermenstrual bleeding.  Sex activity: single partner, contraception - none. Plans to do REI in 2022. Referral placed last yr. Had SAB 2016, no conception since. Last Pap: 10/18/17 Results were: no abnormalities /neg HPV DNA  Hx of STDs: none Endocervical polyp removed 4/19 with neg path. No bleeding with sex.  Last mammogram: 06/01/18 Results: normal, repeat in 12 months. There is no FH of breast cancer. There is no FH of ovarian cancer. The patient does do self-breast exams.  Tobacco use: The patient denies current or previous tobacco use. Alcohol use: none No drug use.  Exercise: moderately active  Colonoscopy: never  She does get adequate calcium and Vitamin D in her diet. She takes macrobid once after sex as preventive for UTIs with sx control. Needs Rx RF. Had UTI 11/21 that didn't respond to macrobid and had horrible odor. C&S showed klebsiella and pt given bactrim with sx relief. Hasn't had sx since.  Normal lipids/labs 9/20  Past Medical History:  Diagnosis Date  . Anemia   . SAB (spontaneous abortion)   . UTI (urinary tract infection)   . Vitamin D deficiency     Past Surgical History:  Procedure Laterality Date  . TONSILLECTOMY AND ADENOIDECTOMY      Family History  Problem Relation Age of Onset  . Uterine cancer Mother 35  . Hypertension Mother   . Hyperlipidemia Mother   . Lung cancer Father 45    Social History   Socioeconomic History  . Marital status: Married    Spouse name: Not on file  . Number of children: Not on file  . Years of education: Not  on file  . Highest education level: Not on file  Occupational History  . Not on file  Tobacco Use  . Smoking status: Never Smoker  . Smokeless tobacco: Never Used  Vaping Use  . Vaping Use: Never used  Substance and Sexual Activity  . Alcohol use: Not Currently  . Drug use: Never  . Sexual activity: Yes    Birth control/protection: None  Other Topics Concern  . Not on file  Social History Narrative  . Not on file   Social Determinants of Health   Financial Resource Strain: Not on file  Food Insecurity: Not on file  Transportation Needs: Not on file  Physical Activity: Not on file  Stress: Not on file  Social Connections: Not on file  Intimate Partner Violence: Not on file    Outpatient Medications Prior to Visit  Medication Sig Dispense Refill  . mometasone (ELOCON) 0.1 % lotion     . nitrofurantoin, macrocrystal-monohydrate, (MACROBID) 100 MG capsule Take 1 capsule after sex as preventive 30 capsule 0   No facility-administered medications prior to visit.     ROS:  Review of Systems  Constitutional: Negative for fatigue, fever and unexpected weight change.  Respiratory: Negative for cough, shortness of breath and wheezing.   Cardiovascular: Negative for chest pain, palpitations and leg swelling.  Gastrointestinal: Negative for blood in stool, constipation, diarrhea, nausea and  vomiting.  Endocrine: Negative for cold intolerance, heat intolerance and polyuria.  Genitourinary: Negative for dyspareunia, dysuria, flank pain, frequency, genital sores, hematuria, menstrual problem, pelvic pain, urgency, vaginal bleeding, vaginal discharge and vaginal pain.  Musculoskeletal: Negative for back pain, joint swelling and myalgias.  Skin: Negative for rash.  Neurological: Negative for dizziness, syncope, light-headedness, numbness and headaches.  Hematological: Negative for adenopathy.  Psychiatric/Behavioral: Negative for agitation, confusion, sleep disturbance and suicidal  ideas. The patient is not nervous/anxious.    BREAST: No symptoms   Objective: BP 100/70   Ht 5\' 2"  (1.575 m)   Wt 149 lb (67.6 kg)   LMP 06/10/2020 (Approximate)   BMI 27.25 kg/m    Physical Exam Constitutional:      Appearance: She is well-developed.  Genitourinary:     Vulva normal.     Right Labia: No rash, tenderness or lesions.    Left Labia: No tenderness, lesions or rash.    No vaginal discharge, erythema or tenderness.      Right Adnexa: not tender and no mass present.    Left Adnexa: not tender and no mass present.    Cervical polyp present.     No cervical motion tenderness or friability.     Uterus is not enlarged or tender.  Breasts:     Right: No mass, nipple discharge, skin change or tenderness.     Left: No mass, nipple discharge, skin change or tenderness.    Neck:     Thyroid: No thyromegaly.  Cardiovascular:     Rate and Rhythm: Normal rate and regular rhythm.     Heart sounds: Normal heart sounds. No murmur heard.   Pulmonary:     Effort: Pulmonary effort is normal.     Breath sounds: Normal breath sounds.  Abdominal:     Palpations: Abdomen is soft.     Tenderness: There is no abdominal tenderness. There is no guarding or rebound.  Musculoskeletal:        General: Normal range of motion.     Cervical back: Normal range of motion.  Lymphadenopathy:     Cervical: No cervical adenopathy.  Neurological:     General: No focal deficit present.     Mental Status: She is alert and oriented to person, place, and time.     Cranial Nerves: No cranial nerve deficit.  Skin:    General: Skin is warm and dry.  Psychiatric:        Mood and Affect: Mood normal.        Behavior: Behavior normal.        Thought Content: Thought content normal.        Judgment: Judgment normal.  Vitals reviewed.      Assessment/Plan: Encounter for annual routine gynecological examination  Encounter for screening mammogram for malignant neoplasm of breast - Plan:  MM 3D SCREEN BREAST BILATERAL; pt to sched mammo  Screening for colon cancer - Plan: Ambulatory referral to Gastroenterology; colonoscopy/cologuard discussed. Pt elects colonoscopy. Ref sent.   Postcoital UTI - Plan: nitrofurantoin, macrocrystal-monohydrate, (MACROBID) 100 MG capsule; Rx RF. Pt to notify me if horrible urine odor recurs for Rx RF bactrim. F/u prn.    Meds ordered this encounter  Medications  . nitrofurantoin, macrocrystal-monohydrate, (MACROBID) 100 MG capsule    Sig: Take 1 capsule after sex as preventive    Dispense:  30 capsule    Refill:  2    Order Specific Question:   Supervising Provider    Answer:  Nadara Mustard [[id number]]             GYN counsel breast self exam, mammography screening, adequate intake of calcium and vitamin D, diet and exercise     F/U  Return in about 1 year (around 07/03/2021).  Morgaine Kimball B. Dorr Perrot, PA-C 07/03/2020 4:32 PM

## 2020-07-11 ENCOUNTER — Encounter: Payer: Self-pay | Admitting: *Deleted

## 2020-08-06 ENCOUNTER — Other Ambulatory Visit: Payer: Self-pay | Admitting: Obstetrics and Gynecology

## 2020-08-06 ENCOUNTER — Telehealth: Payer: Self-pay

## 2020-08-06 MED ORDER — SULFAMETHOXAZOLE-TRIMETHOPRIM 800-160 MG PO TABS: 1.0000 | ORAL_TABLET | Freq: Two times a day (BID) | ORAL | 0 refills | Status: AC

## 2020-08-06 NOTE — Progress Notes (Signed)
Rx RF bactrim for UTI sx.

## 2020-08-06 NOTE — Telephone Encounter (Signed)
Pt calling; has another UTI; urine is strong again; would like refill of what was given before.  [phone]

## 2020-08-06 NOTE — Telephone Encounter (Signed)
Pt aware.

## 2020-08-06 NOTE — Telephone Encounter (Signed)
I sent in RF Bactrim for pt. F/u prn.

## 2021-03-10 DIAGNOSIS — Z20822 Contact with and (suspected) exposure to covid-19: Secondary | ICD-10-CM | POA: Diagnosis not present

## 2021-03-10 DIAGNOSIS — Z03818 Encounter for observation for suspected exposure to other biological agents ruled out: Secondary | ICD-10-CM | POA: Diagnosis not present

## 2021-03-25 DIAGNOSIS — Z03818 Encounter for observation for suspected exposure to other biological agents ruled out: Secondary | ICD-10-CM | POA: Diagnosis not present

## 2021-03-25 DIAGNOSIS — Z20822 Contact with and (suspected) exposure to covid-19: Secondary | ICD-10-CM | POA: Diagnosis not present

## 2021-10-13 ENCOUNTER — Telehealth: Payer: Self-pay

## 2021-10-13 NOTE — Telephone Encounter (Signed)
Pt calling; needs refill of UTI medication; states [facility] usually send in refill; pt wants the last rx for UTI she had as her body is used to the first one; urine is strong again.  [place].  [phone]  Courtesy call to pt ABC is not in office this week but checks her messages periodically; msg will be forwarded to ABC. ?

## 2021-10-14 ENCOUNTER — Other Ambulatory Visit: Payer: Self-pay | Admitting: Obstetrics and Gynecology

## 2021-10-14 ENCOUNTER — Telehealth: Payer: Self-pay

## 2021-10-14 ENCOUNTER — Ambulatory Visit (INDEPENDENT_AMBULATORY_CARE_PROVIDER_SITE_OTHER): Payer: BC Managed Care – PPO

## 2021-10-14 DIAGNOSIS — R300 Dysuria: Secondary | ICD-10-CM

## 2021-10-14 LAB — POCT URINALYSIS DIPSTICK
Bilirubin, UA: NEGATIVE
Blood, UA: POSITIVE
Glucose, UA: NEGATIVE
Ketones, UA: NEGATIVE
Nitrite, UA: NEGATIVE
Protein, UA: NEGATIVE
Spec Grav, UA: 1.02 (ref 1.010–1.025)
Urobilinogen, UA: NEGATIVE E.U./dL — AB
pH, UA: 7 (ref 5.0–8.0)

## 2021-10-14 MED ORDER — SULFAMETHOXAZOLE-TRIMETHOPRIM 800-160 MG PO TABS: 1.0000 | ORAL_TABLET | Freq: Two times a day (BID) | ORAL | 0 refills | Status: AC

## 2021-10-14 NOTE — Telephone Encounter (Signed)
Rx Bactrim eRxd, pt messaged. UA done today.  ?

## 2021-10-14 NOTE — Telephone Encounter (Signed)
Pt came in for UA drop off today, results are in. Leukocytes and blood in urine. Can you please look out for Urine culture or send in RX if you feel like she needs it? (Since you are on call). She has gotten them a lot in the past. Im sending it for culture.  ?

## 2021-10-14 NOTE — Progress Notes (Signed)
Rx bactrim eRxd for UtI sx. UA checked today. ?

## 2021-10-15 ENCOUNTER — Other Ambulatory Visit: Payer: Self-pay | Admitting: Obstetrics and Gynecology

## 2021-10-15 NOTE — Telephone Encounter (Signed)
Looks like [facility] sent her Bactrim yesterday which should help. Thanks- Dr. Jerene Pitch

## 2021-10-16 LAB — URINE CULTURE

## 2021-11-04 ENCOUNTER — Ambulatory Visit (INDEPENDENT_AMBULATORY_CARE_PROVIDER_SITE_OTHER): Payer: BC Managed Care – PPO | Admitting: Obstetrics and Gynecology

## 2021-11-04 ENCOUNTER — Encounter: Payer: Self-pay | Admitting: Obstetrics and Gynecology

## 2021-11-04 ENCOUNTER — Other Ambulatory Visit (HOSPITAL_COMMUNITY)
Admission: RE | Admit: 2021-11-04 | Discharge: 2021-11-04 | Disposition: A | Discharge status: Home / Self Care | Payer: BC Managed Care – PPO | Source: Ambulatory Visit | Attending: Obstetrics and Gynecology | Admitting: Obstetrics and Gynecology

## 2021-11-04 VITALS — BP 128/90 | Ht 62.0 in | Wt 140.0 lb

## 2021-11-04 DIAGNOSIS — Z01419 Encounter for gynecological examination (general) (routine) without abnormal findings: Secondary | ICD-10-CM | POA: Diagnosis not present

## 2021-11-04 DIAGNOSIS — Z1211 Encounter for screening for malignant neoplasm of colon: Secondary | ICD-10-CM

## 2021-11-04 DIAGNOSIS — Z1231 Encounter for screening mammogram for malignant neoplasm of breast: Secondary | ICD-10-CM

## 2021-11-04 DIAGNOSIS — Z1151 Encounter for screening for human papillomavirus (HPV): Secondary | ICD-10-CM

## 2021-11-04 DIAGNOSIS — Z124 Encounter for screening for malignant neoplasm of cervix: Secondary | ICD-10-CM

## 2021-11-04 DIAGNOSIS — N390 Urinary tract infection, site not specified: Secondary | ICD-10-CM

## 2021-11-04 NOTE — Patient Instructions (Signed)
I value your feedback and you entrusting us with your care. If you get a [facility] patient survey, I would appreciate you taking the time to let us know about your experience today. Thank you!  [facility]: [phone]      

## 2021-11-04 NOTE — Progress Notes (Signed)
? ?PCP:  Patient, No Pcp Per (Inactive) ? ? ?Chief Complaint  ?Patient presents with  ? Gynecologic Exam  ?  No concerns  ? ? ? ?HPI: ?     [name] is a 46 y.o. G2P1011 whose LMP was Patient's last menstrual period was 10/11/2020 (approximate)., presents today for her annual examination.  Her menses are absent now since 10/11/20. No PMB, tolerable vasomotor sx.  ? ?Sex activity: single partner, contraception - none. Had SAB 2016. No pain/bleeding/dryness ?Last Pap: 10/18/17 Results were: no abnormalities /neg HPV DNA  ?Hx of STDs: none ?Endocervical polyp removed 4/19 with neg path. No bleeding with sex. ? ?Last mammogram: 06/01/18 Results: normal, repeat in 12 months. ?There is no FH of breast cancer. There is no FH of ovarian cancer. The patient does do self-breast exams. ? ?Tobacco use: The patient denies current or previous tobacco use. ?Alcohol use: none ?No drug use.  ?Exercise: moderately active ? ?Colonoscopy: never; declines cologuard and colonoscopy ref; no rectal bleeding ? ?She does get adequate calcium and Vitamin D in her diet. ?She takes macrobid once after sex as preventive for UTIs with sx control. Doesn't need Rx RF today. Had UTI sx 4/23 with neg culture. Was given Bactrim, still has leftover pills.  ? ?Normal lipids/labs 9/20 ? ?Has 1 episode syncope 1/23. Was sitting and felt really hot with hot flash. Stood up and went to get drink and passed out in kitchen. No sx since. ? ?Past Medical History:  ?Diagnosis Date  ? Anemia   ? SAB (spontaneous abortion)   ? UTI (urinary tract infection)   ? Vitamin D deficiency   ? ? ?Past Surgical History:  ?Procedure Laterality Date  ? TONSILLECTOMY AND ADENOIDECTOMY    ? ? ?Family History  ?Problem Relation Age of Onset  ? Uterine cancer Mother 31  ? Hypertension Mother   ? Hyperlipidemia Mother   ? Lung cancer Father 83  ? ? ?Social History  ? ?Socioeconomic History  ? Marital status: Married  ?  Spouse name: Not on file  ? Number of children: Not  on file  ? Years of education: Not on file  ? Highest education level: Not on file  ?Occupational History  ? Not on file  ?Tobacco Use  ? Smoking status: Never  ? Smokeless tobacco: Never  ?Vaping Use  ? Vaping Use: Never used  ?Substance and Sexual Activity  ? Alcohol use: Not Currently  ? Drug use: Never  ? Sexual activity: Yes  ?  Birth control/protection: None  ?Other Topics Concern  ? Not on file  ?Social History Narrative  ? Not on file  ? ?Social Determinants of Health  ? ?Financial Resource Strain: Not on file  ?Food Insecurity: Not on file  ?Transportation Needs: Not on file  ?Physical Activity: Not on file  ?Stress: Not on file  ?Social Connections: Not on file  ?Intimate Partner Violence: Not on file  ? ? ?Outpatient Medications Prior to Visit  ?Medication Sig Dispense Refill  ? nitrofurantoin, macrocrystal-monohydrate, (MACROBID) 100 MG capsule Take 1 capsule after sex as preventive (Patient not taking: Reported on 11/04/2021) 30 capsule 2  ? mometasone (ELOCON) 0.1 % lotion     ? ?No facility-administered medications prior to visit.  ? ? ? ?ROS: ? ?Review of Systems  ?Constitutional:  Negative for fatigue, fever and unexpected weight change.  ?Respiratory:  Negative for cough, shortness of breath and wheezing.   ?Cardiovascular:  Negative for chest pain,  palpitations and leg swelling.  ?Gastrointestinal:  Negative for blood in stool, constipation, diarrhea, nausea and vomiting.  ?Endocrine: Negative for cold intolerance, heat intolerance and polyuria.  ?Genitourinary:  Negative for dyspareunia, dysuria, flank pain, frequency, genital sores, hematuria, menstrual problem, pelvic pain, urgency, vaginal bleeding, vaginal discharge and vaginal pain.  ?Musculoskeletal:  Negative for back pain, joint swelling and myalgias.  ?Skin:  Negative for rash.  ?Neurological:  Negative for dizziness, syncope, light-headedness, numbness and headaches.  ?Hematological:  Negative for adenopathy.  ?Psychiatric/Behavioral:   Negative for agitation, confusion, sleep disturbance and suicidal ideas. The patient is not nervous/anxious.   ?BREAST: No symptoms ? ? ?Objective: ?BP 128/90   Ht 5\' 2"  (1.575 m)   Wt 140 lb (63.5 kg)   LMP 10/11/2020 (Approximate)   BMI 25.61 kg/m?  ? ? ?Physical Exam ?Constitutional:   ?   Appearance: She is well-developed.  ?Genitourinary:  ?   Vulva normal.  ?   Right Labia: No rash, tenderness or lesions. ?   Left Labia: No tenderness, lesions or rash. ?   No vaginal discharge, erythema or tenderness.  ? ?   Right Adnexa: not tender and no mass present. ?   Left Adnexa: not tender and no mass present. ?   Cervical polyp present.  ?   No cervical motion tenderness or friability.  ?   Uterus is not enlarged or tender.  ?Breasts: ?   Right: No mass, nipple discharge, skin change or tenderness.  ?   Left: No mass, nipple discharge, skin change or tenderness.  ?Neck:  ?   Thyroid: No thyromegaly.  ?Cardiovascular:  ?   Rate and Rhythm: Normal rate and regular rhythm.  ?   Heart sounds: Normal heart sounds. No murmur heard. ?Pulmonary:  ?   Effort: Pulmonary effort is normal.  ?   Breath sounds: Normal breath sounds.  ?Abdominal:  ?   Palpations: Abdomen is soft.  ?   Tenderness: There is no abdominal tenderness. There is no guarding or rebound.  ?Musculoskeletal:     ?   General: Normal range of motion.  ?   Cervical back: Normal range of motion.  ?Lymphadenopathy:  ?   Cervical: No cervical adenopathy.  ?Neurological:  ?   General: No focal deficit present.  ?   Mental Status: She is alert and oriented to person, place, and time.  ?   Cranial Nerves: No cranial nerve deficit.  ?Skin: ?   General: Skin is warm and dry.  ?Psychiatric:     ?   Mood and Affect: Mood normal.     ?   Behavior: Behavior normal.     ?   Thought Content: Thought content normal.     ?   Judgment: Judgment normal.  ?Vitals reviewed.  ?  ?NEG FOBT ? ?Assessment/Plan: ?Encounter for annual routine gynecological examination ? ?Cervical  cancer screening - Plan: Cytology - PAP ? ?Screening for HPV (human papillomavirus) - Plan: Cytology - PAP ? ?Encounter for screening mammogram for malignant neoplasm of breast - Plan: MM 3D SCREEN BREAST BILATERAL; pt to schedule mammo ? ?Screening for colon cancer - Plan: IFOBT POC (occult bld, rslt in office); neg FOBT; declines cologuard or colonoscopy. F/u prn.  ? ?Postcoital UTI - Plan: will call for Rx RF prn. Sometimes needs bactrim due to klebsiella on C&S in past that didn't respond to macrobid. ? ?Syncope--1 episode 1/23. F/u for further eval if sx recur.  ?     ?  GYN counsel breast self exam, mammography screening, adequate intake of calcium and vitamin D, diet and exercise ? ? ?  F/U ? Return in about 1 year (around 11/05/2022). ? ?Znya Albino B. Seabron Iannello, PA-C ?11/04/2021 ?11:48 AM  ? ? ?

## 2021-11-06 LAB — CYTOLOGY - PAP
Comment: NEGATIVE
Diagnosis: NEGATIVE
High risk HPV: NEGATIVE

## 2021-12-09 ENCOUNTER — Ambulatory Visit
Admission: RE | Admit: 2021-12-09 | Discharge: 2021-12-09 | Disposition: A | Discharge status: Home / Self Care | Payer: BC Managed Care – PPO | Source: Ambulatory Visit | Attending: Obstetrics and Gynecology | Admitting: Obstetrics and Gynecology

## 2021-12-09 DIAGNOSIS — Z1231 Encounter for screening mammogram for malignant neoplasm of breast: Secondary | ICD-10-CM | POA: Insufficient documentation

## 2022-03-09 ENCOUNTER — Ambulatory Visit (INDEPENDENT_AMBULATORY_CARE_PROVIDER_SITE_OTHER): Payer: BC Managed Care – PPO | Admitting: Obstetrics & Gynecology

## 2022-03-09 ENCOUNTER — Encounter: Payer: Self-pay | Admitting: Obstetrics & Gynecology

## 2022-03-09 VITALS — BP 110/78 | HR 71 | Resp 16 | Ht 62.0 in | Wt 143.6 lb

## 2022-03-09 DIAGNOSIS — R82998 Other abnormal findings in urine: Secondary | ICD-10-CM

## 2022-03-09 DIAGNOSIS — R8290 Unspecified abnormal findings in urine: Secondary | ICD-10-CM | POA: Diagnosis not present

## 2022-03-09 LAB — POCT URINALYSIS DIPSTICK
Bilirubin, UA: NEGATIVE
Glucose, UA: NEGATIVE
Ketones, UA: NEGATIVE
Nitrite, UA: NEGATIVE
Protein, UA: POSITIVE — AB
Spec Grav, UA: 1.015 (ref 1.010–1.025)
Urobilinogen, UA: 0.2 E.U./dL
pH, UA: 5 (ref 5.0–8.0)

## 2022-03-11 ENCOUNTER — Telehealth: Payer: Self-pay

## 2022-03-11 LAB — URINE CULTURE

## 2022-03-11 NOTE — Telephone Encounter (Signed)
Pt calling to see if rx is ready; was seen for strong urine; culture reviewed and hasn't heard anything about a rx.  [phone]  Adv pt she is looking at the dip results we did in the office; culture is not back; should be back tomorrow.

## 2022-03-13 MED ORDER — SULFAMETHOXAZOLE-TRIMETHOPRIM 800-160 MG PO TABS: 1.0000 | ORAL_TABLET | Freq: Two times a day (BID) | ORAL | 0 refills | Status: DC

## 2022-03-13 NOTE — Telephone Encounter (Signed)
Urine culture results are back which show E.Coli, when I contacted patient on phone she requested to be on same antibiotic that she was prescribed previously in April called Sulfamethoxazole-trimethoprim, patient states that she does not want to be prescribed Macrobid because she feels that her body is resistant to antibiotic now. According to urine culture report Trimethprom/Sulfa is sensitive to this. Will send in antibiotic for patient to start. KW

## 2022-03-13 NOTE — Addendum Note (Signed)
Addended by: Fonda Kinder on: 03/13/2022 02:31 PM   Modules accepted: Orders

## 2022-03-13 NOTE — Telephone Encounter (Signed)
Patient is calling to follow up on if culture rest are back and if something needs to be called in. Please advise?

## 2022-04-13 NOTE — Progress Notes (Deleted)
    GYNECOLOGY PROGRESS NOTE  Subjective:    Patient ID: Kathy Hicks, female    DOB: April 02, 1976, 45 y.o.   MRN: [id number]  HPI  Patient is a 46 y.o. G42P1011 female who presents for   {Common ambulatory SmartLinks:19316}  Review of Systems {ros; complete:30496}   Objective:   Blood pressure 110/78, pulse 71, resp. rate 16, height 5\' 2"  (1.575 m), weight 143 lb 9.6 oz (65.1 kg). Body mass index is 26.26 kg/m. General appearance: {general exam:16600} Abdomen: {abdominal exam:16834} Pelvic: {pelvic exam:16852::"cervix normal in appearance","external genitalia normal","no adnexal masses or tenderness","no cervical motion tenderness","rectovaginal septum normal","uterus normal size, shape, and consistency","vagina normal without discharge"} Extremities: {extremity exam:5109} Neurologic: {neuro exam:17854}   Assessment:   1. Abnormal urine odor   2. Leukocytes in urine      Plan:   1. Abnormal urine odor *** - POCT Urinalysis Dipstick  2. Leukocytes in urine *** - Urine Culture

## 2022-04-13 NOTE — Progress Notes (Signed)
Subjective:    Kathy Hicks is a 46 y.o. female who complains of abnormal urine color. She has had symptoms for 5 days. Patient also complains of  abnormal urine color  . Patient denies vaginal discharge. Patient does have a history of recurrent UTI. Patient  undocumented  have a history of pyelonephritis.   The following portions of the patient's history were reviewed and updated as appropriate: allergies, current medications, past family history, past medical history, past social history, past surgical history, and problem list.  Review of Systems A comprehensive review of systems was negative.    Objective:    BP 110/78   Pulse 71   Resp 16   Ht 5\' 2"  (1.575 m)   Wt 143 lb 9.6 oz (65.1 kg)   BMI 26.26 kg/m  General appearance: alert, cooperative, and no distress Abdomen: soft, non-tender; bowel sounds normal; no masses,  no organomegaly Pelvic: cervix normal in appearance, external genitalia normal, no adnexal masses or tenderness, no cervical motion tenderness, uterus normal size, shape, and consistency, and vagina normal without discharge Extremities: extremities normal, atraumatic, no cyanosis or edema Skin: Skin color, texture, turgor normal. No rashes or lesions  Laboratory:  Urine dipstick: 2+ for leukocyte esterase.   Micro exam:  escherichia coli+ bacteria.    Assessment:   UTI     Plan:  POTC and Urine dipstick  Follow up if symptoms not improving, and as needed.   Rosario Adie, MD  04/13/2022 6:47 AM

## 2022-06-29 ENCOUNTER — Ambulatory Visit (INDEPENDENT_AMBULATORY_CARE_PROVIDER_SITE_OTHER): Payer: BC Managed Care – PPO

## 2022-06-29 ENCOUNTER — Other Ambulatory Visit: Payer: Self-pay | Admitting: Obstetrics and Gynecology

## 2022-06-29 ENCOUNTER — Telehealth: Payer: Self-pay

## 2022-06-29 VITALS — BP 133/81 | HR 91 | Ht 62.0 in | Wt 138.6 lb

## 2022-06-29 DIAGNOSIS — R8290 Unspecified abnormal findings in urine: Secondary | ICD-10-CM

## 2022-06-29 DIAGNOSIS — R350 Frequency of micturition: Secondary | ICD-10-CM | POA: Diagnosis not present

## 2022-06-29 LAB — POCT URINALYSIS DIPSTICK
Bilirubin, UA: NEGATIVE
Glucose, UA: NEGATIVE
Ketones, UA: NEGATIVE
Nitrite, UA: POSITIVE
Protein, UA: NEGATIVE
Spec Grav, UA: 1.01 (ref 1.010–1.025)
Urobilinogen, UA: 0.2 E.U./dL
pH, UA: 6.5 (ref 5.0–8.0)

## 2022-06-29 MED ORDER — SULFAMETHOXAZOLE-TRIMETHOPRIM 800-160 MG PO TABS: 1.0000 | ORAL_TABLET | Freq: Two times a day (BID) | ORAL | 1 refills | Status: DC

## 2022-06-29 NOTE — Telephone Encounter (Signed)
Patient called. She has urinary symptoms. She was evaluated in August for UTI and prescribed bactrim. She has a history of recurrent UTI and pyelonephritis. I asked the patient to come in for nurse visit. She verbalized understanding and will be in today.

## 2022-06-29 NOTE — Progress Notes (Signed)
Patient presents today due to UTI symptoms. She states urinary frequency, strong odor . Urine dipstick preformed, showing nitrates, blood and leukocytes. Per office protocol urine culture sent, Bactrim  sent to pharmacy on file, instructed to take bid for 7 days. Patient states no other concerns at this time. All questions answered.

## 2022-07-01 LAB — URINE CULTURE

## 2022-09-01 DIAGNOSIS — R8290 Unspecified abnormal findings in urine: Secondary | ICD-10-CM | POA: Diagnosis not present

## 2022-09-01 DIAGNOSIS — R059 Cough, unspecified: Secondary | ICD-10-CM | POA: Diagnosis not present

## 2022-09-01 DIAGNOSIS — N3001 Acute cystitis with hematuria: Secondary | ICD-10-CM | POA: Diagnosis not present

## 2022-09-01 DIAGNOSIS — J069 Acute upper respiratory infection, unspecified: Secondary | ICD-10-CM | POA: Diagnosis not present

## 2022-09-07 ENCOUNTER — Other Ambulatory Visit: Payer: Self-pay | Admitting: Obstetrics and Gynecology

## 2022-09-07 DIAGNOSIS — Z111 Encounter for screening for respiratory tuberculosis: Secondary | ICD-10-CM

## 2022-09-07 NOTE — Progress Notes (Signed)
TB order for work form

## 2022-09-08 ENCOUNTER — Other Ambulatory Visit: Payer: BC Managed Care – PPO

## 2022-09-08 DIAGNOSIS — Z111 Encounter for screening for respiratory tuberculosis: Secondary | ICD-10-CM | POA: Diagnosis not present

## 2022-09-08 NOTE — Addendum Note (Signed)
Addended by: Meryl Dare on: 09/08/2022 10:56 AM   Modules accepted: Orders

## 2022-09-10 ENCOUNTER — Telehealth: Payer: Self-pay | Admitting: Obstetrics and Gynecology

## 2022-09-10 NOTE — Telephone Encounter (Signed)
Pt called in about the form that was being completed and emailed for her.  She gave the wrong email address earlier.  This is the correct email address-  [email or web address]

## 2022-09-11 LAB — QUANTIFERON-TB GOLD PLUS
QuantiFERON Mitogen Value: >10 IU/mL
QuantiFERON Nil Value: 0.02 IU/mL
QuantiFERON TB1 Ag Value: 0.04 IU/mL
QuantiFERON TB2 Ag Value: 0.03 IU/mL
QuantiFERON-TB Gold Plus: NEGATIVE

## 2022-09-12 NOTE — Progress Notes (Signed)
Pls complete form for pt. Thx

## 2022-10-28 ENCOUNTER — Telehealth: Payer: Self-pay

## 2022-10-28 DIAGNOSIS — N390 Urinary tract infection, site not specified: Secondary | ICD-10-CM

## 2022-10-28 MED ORDER — SULFAMETHOXAZOLE-TRIMETHOPRIM 800-160 MG PO TABS: 1.0000 | ORAL_TABLET | Freq: Two times a day (BID) | ORAL | 0 refills | Status: DC

## 2022-10-28 NOTE — Telephone Encounter (Signed)
Pt called triage and said she is pretty sure she has a UTI. She has the same exact symptoms from last visit (06/29/22): urinary frequency and odor. No burning urinating or blood in urine, also denies pelvic pain, fevers. Bactrim Rx sent and advised if sx persist after completing abx to return to office.

## 2022-11-08 NOTE — Progress Notes (Unsigned)
PCP:  Patient, No Pcp Per   No chief complaint on file.    HPI:      [name] is a 47 y.o. G2P1011 whose LMP was No LMP recorded. (Menstrual status: Other)., presents today for her annual examination.  Her menses are absent now since 10/11/20. No PMB, tolerable vasomotor sx.   Sex activity: single partner, contraception - none. Had SAB 2016. No pain/bleeding/dryness Last Pap: 11/04/21 Results were: no abnormalities /neg HPV DNA  Hx of STDs: none Endocervical polyp removed 4/19 with neg path. No bleeding with sex.  Last mammogram: 12/09/21 Results: normal, repeat in 12 months. There is no FH of breast cancer. There is no FH of ovarian cancer. The patient does do self-breast exams.  Tobacco use: The patient denies current or previous tobacco use. Alcohol use: none No drug use.  Exercise: moderately active  Colonoscopy: never; declines cologuard and colonoscopy ref; no rectal bleeding  She does get adequate calcium and Vitamin D in her diet. She takes macrobid once after sex as preventive for UTIs with sx control. Doesn't need Rx RF today. Had UTI sx 4/23 with neg culture. Was given Bactrim, still has leftover pills.   Normal lipids/labs 9/20  Has 1 episode syncope 1/23. Was sitting and felt really hot with hot flash. Stood up and went to get drink and passed out in kitchen. No sx since.  Past Medical History:  Diagnosis Date   Anemia    SAB (spontaneous abortion)    UTI (urinary tract infection)    Vitamin D deficiency     Past Surgical History:  Procedure Laterality Date   TONSILLECTOMY AND ADENOIDECTOMY      Family History  Problem Relation Age of Onset   Uterine cancer Mother 44   Hypertension Mother    Hyperlipidemia Mother    Lung cancer Father 65   Breast cancer Neg Hx     Social History   Socioeconomic History   Marital status: Married    Spouse name: Not on file   Number of children: Not on file   Years of education: Not on file   Highest  education level: Not on file  Occupational History   Not on file  Tobacco Use   Smoking status: Never   Smokeless tobacco: Never  Vaping Use   Vaping Use: Never used  Substance and Sexual Activity   Alcohol use: Not Currently   Drug use: Never   Sexual activity: Yes    Birth control/protection: None  Other Topics Concern   Not on file  Social History Narrative   Not on file   Social Determinants of Health   Financial Resource Strain: Not on file  Food Insecurity: Not on file  Transportation Needs: Not on file  Physical Activity: Sufficiently Active (10/18/2017)   Exercise Vital Sign    Days of Exercise per Week: 7 days    Minutes of Exercise per Session: 30 min  Stress: Not on file  Social Connections: Not on file  Intimate Partner Violence: Not on file    Outpatient Medications Prior to Visit  Medication Sig Dispense Refill   nitrofurantoin, macrocrystal-monohydrate, (MACROBID) 100 MG capsule Take 1 capsule after sex as preventive (Patient not taking: Reported on 11/04/2021) 30 capsule 2   sulfamethoxazole-trimethoprim (BACTRIM DS) 800-160 MG tablet Take 1 tablet by mouth 2 (two) times daily. 14 tablet 0   sulfamethoxazole-trimethoprim (BACTRIM DS) 800-160 MG tablet Take 1 tablet by mouth 2 (two) times daily. 14  tablet 1   sulfamethoxazole-trimethoprim (BACTRIM DS) 800-160 MG tablet Take 1 tablet by mouth 2 (two) times daily. 14 tablet 0   No facility-administered medications prior to visit.     ROS:  Review of Systems  Constitutional:  Negative for fatigue, fever and unexpected weight change.  Respiratory:  Negative for cough, shortness of breath and wheezing.   Cardiovascular:  Negative for chest pain, palpitations and leg swelling.  Gastrointestinal:  Negative for blood in stool, constipation, diarrhea, nausea and vomiting.  Endocrine: Negative for cold intolerance, heat intolerance and polyuria.  Genitourinary:  Negative for dyspareunia, dysuria, flank pain,  frequency, genital sores, hematuria, menstrual problem, pelvic pain, urgency, vaginal bleeding, vaginal discharge and vaginal pain.  Musculoskeletal:  Negative for back pain, joint swelling and myalgias.  Skin:  Negative for rash.  Neurological:  Negative for dizziness, syncope, light-headedness, numbness and headaches.  Hematological:  Negative for adenopathy.  Psychiatric/Behavioral:  Negative for agitation, confusion, sleep disturbance and suicidal ideas. The patient is not nervous/anxious.    BREAST: No symptoms   Objective: There were no vitals taken for this visit.   Physical Exam Constitutional:      Appearance: She is well-developed.  Genitourinary:     Vulva normal.     Right Labia: No rash, tenderness or lesions.    Left Labia: No tenderness, lesions or rash.    No vaginal discharge, erythema or tenderness.      Right Adnexa: not tender and no mass present.    Left Adnexa: not tender and no mass present.    Cervical polyp present.     No cervical motion tenderness or friability.     Uterus is not enlarged or tender.  Breasts:    Right: No mass, nipple discharge, skin change or tenderness.     Left: No mass, nipple discharge, skin change or tenderness.  Neck:     Thyroid: No thyromegaly.  Cardiovascular:     Rate and Rhythm: Normal rate and regular rhythm.     Heart sounds: Normal heart sounds. No murmur heard. Pulmonary:     Effort: Pulmonary effort is normal.     Breath sounds: Normal breath sounds.  Abdominal:     Palpations: Abdomen is soft.     Tenderness: There is no abdominal tenderness. There is no guarding or rebound.  Musculoskeletal:        General: Normal range of motion.     Cervical back: Normal range of motion.  Lymphadenopathy:     Cervical: No cervical adenopathy.  Neurological:     General: No focal deficit present.     Mental Status: She is alert and oriented to person, place, and time.     Cranial Nerves: No cranial nerve deficit.   Skin:    General: Skin is warm and dry.  Psychiatric:        Mood and Affect: Mood normal.        Behavior: Behavior normal.        Thought Content: Thought content normal.        Judgment: Judgment normal.  Vitals reviewed.     NEG FOBT  Assessment/Plan: Encounter for annual routine gynecological examination  Cervical cancer screening - Plan: Cytology - PAP  Screening for HPV (human papillomavirus) - Plan: Cytology - PAP  Encounter for screening mammogram for malignant neoplasm of breast - Plan: MM 3D SCREEN BREAST BILATERAL; pt to schedule mammo  Screening for colon cancer - Plan: IFOBT POC (occult bld, rslt in office);  neg FOBT; declines cologuard or colonoscopy. F/u prn.   Postcoital UTI - Plan: will call for Rx RF prn. Sometimes needs bactrim due to klebsiella on C&S in past that didn't respond to macrobid.  Syncope--1 episode 1/23. F/u for further eval if sx recur.       GYN counsel breast self exam, mammography screening, adequate intake of calcium and vitamin D, diet and exercise     F/U  No follow-ups on file.  Mahek Schlesinger B. Azekiel Cremer, PA-C 11/08/2022 9:25 PM

## 2022-11-09 ENCOUNTER — Ambulatory Visit (INDEPENDENT_AMBULATORY_CARE_PROVIDER_SITE_OTHER): Payer: BC Managed Care – PPO | Admitting: Obstetrics and Gynecology

## 2022-11-09 ENCOUNTER — Encounter: Payer: Self-pay | Admitting: Obstetrics and Gynecology

## 2022-11-09 VITALS — BP 120/80 | Ht 62.0 in | Wt 137.0 lb

## 2022-11-09 DIAGNOSIS — Z01419 Encounter for gynecological examination (general) (routine) without abnormal findings: Secondary | ICD-10-CM

## 2022-11-09 DIAGNOSIS — N390 Urinary tract infection, site not specified: Secondary | ICD-10-CM

## 2022-11-09 DIAGNOSIS — Z1211 Encounter for screening for malignant neoplasm of colon: Secondary | ICD-10-CM

## 2022-11-09 DIAGNOSIS — Z1231 Encounter for screening mammogram for malignant neoplasm of breast: Secondary | ICD-10-CM

## 2022-11-09 DIAGNOSIS — Z0000 Encounter for general adult medical examination without abnormal findings: Secondary | ICD-10-CM

## 2022-11-09 DIAGNOSIS — Z13220 Encounter for screening for lipoid disorders: Secondary | ICD-10-CM

## 2022-11-09 MED ORDER — SULFAMETHOXAZOLE-TRIMETHOPRIM 800-160 MG PO TABS: ORAL_TABLET | ORAL | 2 refills | Status: DC

## 2022-11-09 NOTE — Patient Instructions (Signed)
I value your feedback and you entrusting us with your care. If you get a [facility] patient survey, I would appreciate you taking the time to let us know about your experience today. Thank you!  [facility]: [phone]      

## 2022-12-01 ENCOUNTER — Other Ambulatory Visit: Payer: Self-pay

## 2022-12-01 DIAGNOSIS — N390 Urinary tract infection, site not specified: Secondary | ICD-10-CM

## 2022-12-01 MED ORDER — SULFAMETHOXAZOLE-TRIMETHOPRIM 800-160 MG PO TABS: ORAL_TABLET | ORAL | 2 refills | Status: DC

## 2022-12-01 NOTE — Telephone Encounter (Signed)
Pt calling for another refill of sulfamethoxazole as she forgot to p/u rx when it was sent in last.  [phone]  Pt aware refill eRx'd.

## 2022-12-10 ENCOUNTER — Telehealth: Payer: Self-pay

## 2022-12-10 NOTE — Telephone Encounter (Signed)
Pt calling; on [phone] was given Sulfamethoxazole for UTI prevention; her urine is still strong; has 2d left on rx; should she finish it or could it be that her body is immune to it?  Also, has fasting blood work that needs to be drawn; can come today however she has had coffee and water only today and doesn't know if that will mess up the tests.  [phone]  Adv pt to finish the medication.  Pt states she had cream and sugar in her coffee; adv will have the schedulers call her to schedule lab appt and to go ahead and eat a good lunch today.  Pt voices understanding.

## 2022-12-10 NOTE — Telephone Encounter (Signed)
The patient was contacted and scheduled for labs 5/31 at 8:00 am

## 2022-12-11 ENCOUNTER — Other Ambulatory Visit: Payer: BC Managed Care – PPO

## 2022-12-11 ENCOUNTER — Ambulatory Visit (INDEPENDENT_AMBULATORY_CARE_PROVIDER_SITE_OTHER): Payer: BC Managed Care – PPO

## 2022-12-11 VITALS — Ht 62.0 in | Wt 136.0 lb

## 2022-12-11 DIAGNOSIS — Z0000 Encounter for general adult medical examination without abnormal findings: Secondary | ICD-10-CM

## 2022-12-11 DIAGNOSIS — R399 Unspecified symptoms and signs involving the genitourinary system: Secondary | ICD-10-CM

## 2022-12-11 DIAGNOSIS — Z13220 Encounter for screening for lipoid disorders: Secondary | ICD-10-CM

## 2022-12-11 LAB — POCT URINALYSIS DIPSTICK
Bilirubin, UA: NEGATIVE
Glucose, UA: NEGATIVE
Ketones, UA: NEGATIVE
Nitrite, UA: POSITIVE
Protein, UA: NEGATIVE
Spec Grav, UA: 1.015 (ref 1.010–1.025)
Urobilinogen, UA: 0.2 E.U./dL
pH, UA: 5 (ref 5.0–8.0)

## 2022-12-11 NOTE — Progress Notes (Signed)
    NURSE VISIT NOTE  Subjective:    Patient ID: Kathy Hicks, female    DOB: [birth year], 47 y.o.   MRN: [id number]       HPI  Patient is a 47 y.o. G60P1011 female who presents for cloudy malordorous urine for 5 days.   Patient does have a history of recurrent UTI.  Patient does not have a history of pyelonephritis.    Objective:    Ht 5\' 2"  (1.575 m)   Wt 136 lb (61.7 kg)   BMI 24.87 kg/m    Lab Review  Results for orders placed or performed in visit on 12/11/22  POCT Urinalysis Dipstick  Result Value Ref Range   Color, UA yellow    Clarity, UA clear    Glucose, UA Negative Negative   Bilirubin, UA neg    Ketones, UA neg    Spec Grav, UA 1.015 1.010 - 1.025   Blood, UA mod    pH, UA 5.0 5.0 - 8.0   Protein, UA Negative Negative   Urobilinogen, UA 0.2 0.2 or 1.0 E.U./dL   Nitrite, UA positive    Leukocytes, UA Trace (A) Negative   Appearance     Odor      Assessment:   1. UTI symptoms      Plan:   Urine Culture Sent. Maintain adequate hydration.  Currently taking Bactrim DS, states she has missed doses but will continue to take.      Loman Chroman, CMA

## 2022-12-12 LAB — COMPREHENSIVE METABOLIC PANEL
ALT: 12 IU/L (ref 0–32)
AST: 15 IU/L (ref 0–40)
Albumin/Globulin Ratio: 1.8 (ref 1.2–2.2)
Albumin: 4.5 g/dL (ref 3.9–4.9)
Alkaline Phosphatase: 68 IU/L (ref 44–121)
BUN/Creatinine Ratio: 14 (ref 9–23)
BUN: 11 mg/dL (ref 6–24)
Bilirubin Total: <0.2 mg/dL (ref 0.0–1.2)
CO2: 25 mmol/L (ref 20–29)
Calcium: 10.1 mg/dL (ref 8.7–10.2)
Chloride: 102 mmol/L (ref 96–106)
Creatinine, Ser: 0.8 mg/dL (ref 0.57–1.00)
Globulin, Total: 2.5 g/dL (ref 1.5–4.5)
Glucose: 80 mg/dL (ref 70–99)
Potassium: 4.4 mmol/L (ref 3.5–5.2)
Sodium: 141 mmol/L (ref 134–144)
Total Protein: 7 g/dL (ref 6.0–8.5)
eGFR: 91 mL/min/{1.73_m2} (ref >59)

## 2022-12-12 LAB — LIPID PANEL
Chol/HDL Ratio: 2.6 ratio (ref 0.0–4.4)
Cholesterol, Total: 263 mg/dL — ABNORMAL HIGH (ref 100–199)
HDL: 101 mg/dL (ref >39)
LDL Chol Calc (NIH): 152 mg/dL — ABNORMAL HIGH (ref 0–99)
Triglycerides: 65 mg/dL (ref 0–149)
VLDL Cholesterol Cal: 10 mg/dL (ref 5–40)

## 2022-12-15 LAB — URINE CULTURE

## 2022-12-16 ENCOUNTER — Other Ambulatory Visit: Payer: Self-pay

## 2022-12-16 DIAGNOSIS — N390 Urinary tract infection, site not specified: Secondary | ICD-10-CM

## 2022-12-16 MED ORDER — SULFAMETHOXAZOLE-TRIMETHOPRIM 800-160 MG PO TABS: 1.0000 | ORAL_TABLET | Freq: Two times a day (BID) | ORAL | 1 refills | Status: DC

## 2022-12-18 ENCOUNTER — Telehealth: Payer: Self-pay

## 2022-12-18 NOTE — Telephone Encounter (Signed)
Pt calling; isn't sure she should take another round of Bactrim as she is only a little better; urine is still strong; wonders if her body is getting immune to Bactrim; should she take this round or should a diff rx be rx'd?  Pt aware [facility] out of the office but is checking msgs periodically.

## 2022-12-20 ENCOUNTER — Encounter: Payer: Self-pay | Admitting: Obstetrics and Gynecology

## 2022-12-20 ENCOUNTER — Other Ambulatory Visit: Payer: Self-pay | Admitting: Obstetrics and Gynecology

## 2022-12-20 MED ORDER — NITROFURANTOIN MONOHYD MACRO 100 MG PO CAPS: ORAL_CAPSULE | ORAL | 1 refills | Status: AC

## 2022-12-20 MED ORDER — NITROFURANTOIN MONOHYD MACRO 100 MG PO CAPS: 100.0000 mg | ORAL_CAPSULE | Freq: Two times a day (BID) | ORAL | 0 refills | Status: AC

## 2022-12-20 NOTE — Telephone Encounter (Signed)
FYI.Marland KitchenMarland KitchenPt's C&S from 12/11/22 was resistant to bactrim (trimethoprim) and that was what was called in then and again by Missy a couple days ago. I messaged pt letting her know I changed it to macrobid. Thx.

## 2022-12-20 NOTE — Progress Notes (Signed)
Rx macrobid for UTI and Rx RF for PC tx. E. coli on last C&S 12/11/22 was resistant to Bactrim.

## 2022-12-25 ENCOUNTER — Ambulatory Visit
Admission: RE | Admit: 2022-12-25 | Discharge: 2022-12-25 | Disposition: A | Discharge status: Home / Self Care | Payer: PRIVATE HEALTH INSURANCE | Source: Ambulatory Visit | Attending: Obstetrics and Gynecology | Admitting: Obstetrics and Gynecology

## 2022-12-25 DIAGNOSIS — Z1231 Encounter for screening mammogram for malignant neoplasm of breast: Secondary | ICD-10-CM | POA: Insufficient documentation

## 2023-01-19 IMAGING — MG MM DIGITAL SCREENING BILAT W/ TOMO AND CAD
6 of 10 series · 6 of 30 positions shown · non-contrast
Comparison: Previous exam(s).

CLINICAL DATA: Screening.

EXAM:
DIGITAL SCREENING BILATERAL MAMMOGRAM WITH TOMOSYNTHESIS AND CAD
TECHNIQUE: Bilateral screening digital craniocaudal and mediolateral oblique
mammograms were obtained. Bilateral screening digital breast
tomosynthesis was performed. The images were evaluated with
computer-aided detection.

[R CC synth-2D]
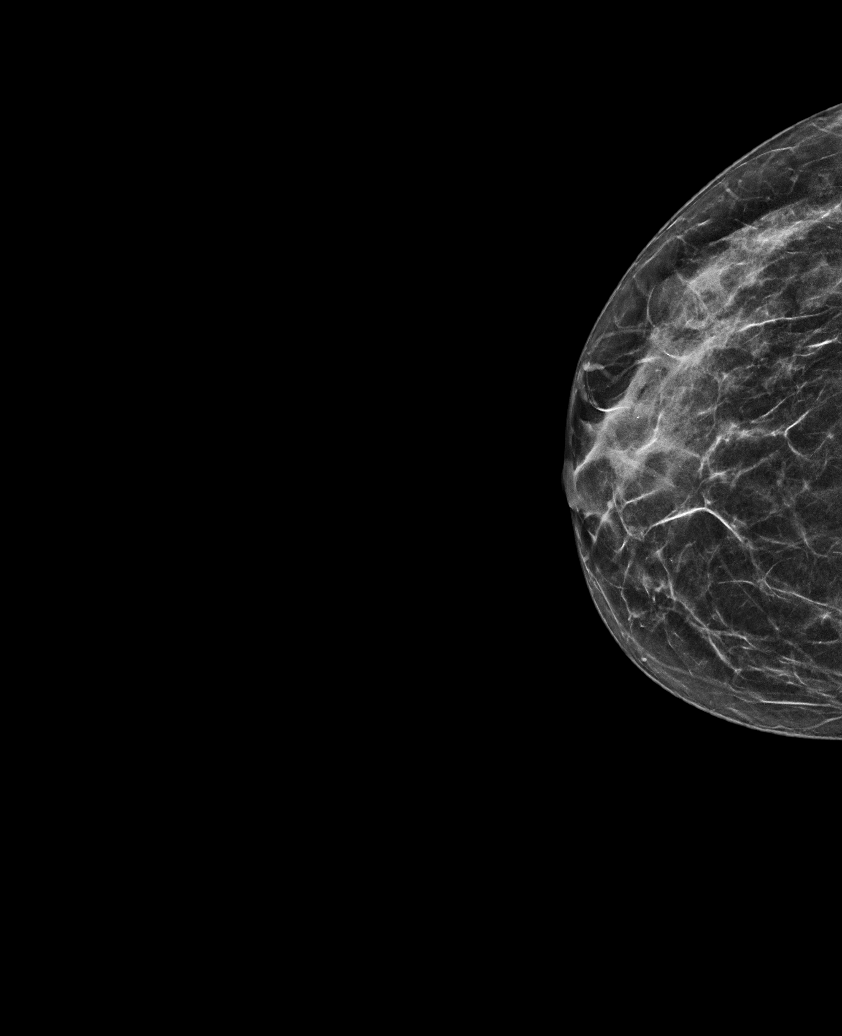

[R XCCL synth-2D]
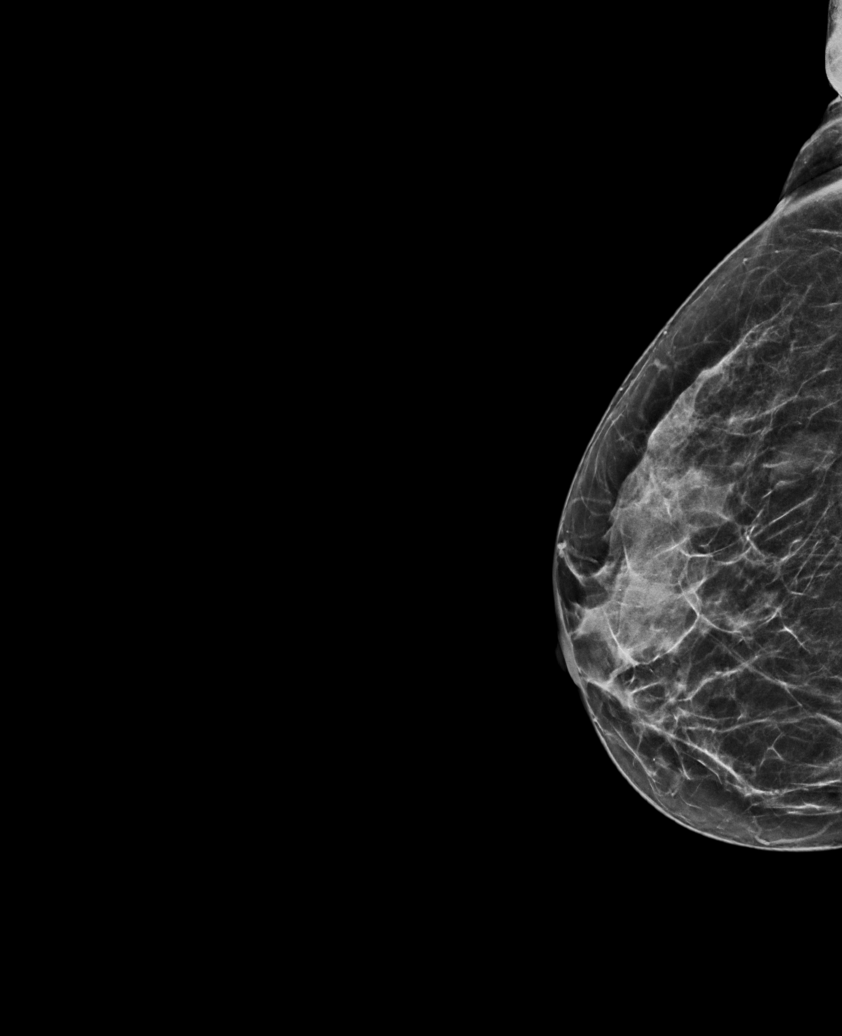

[L MLO synth-2D]
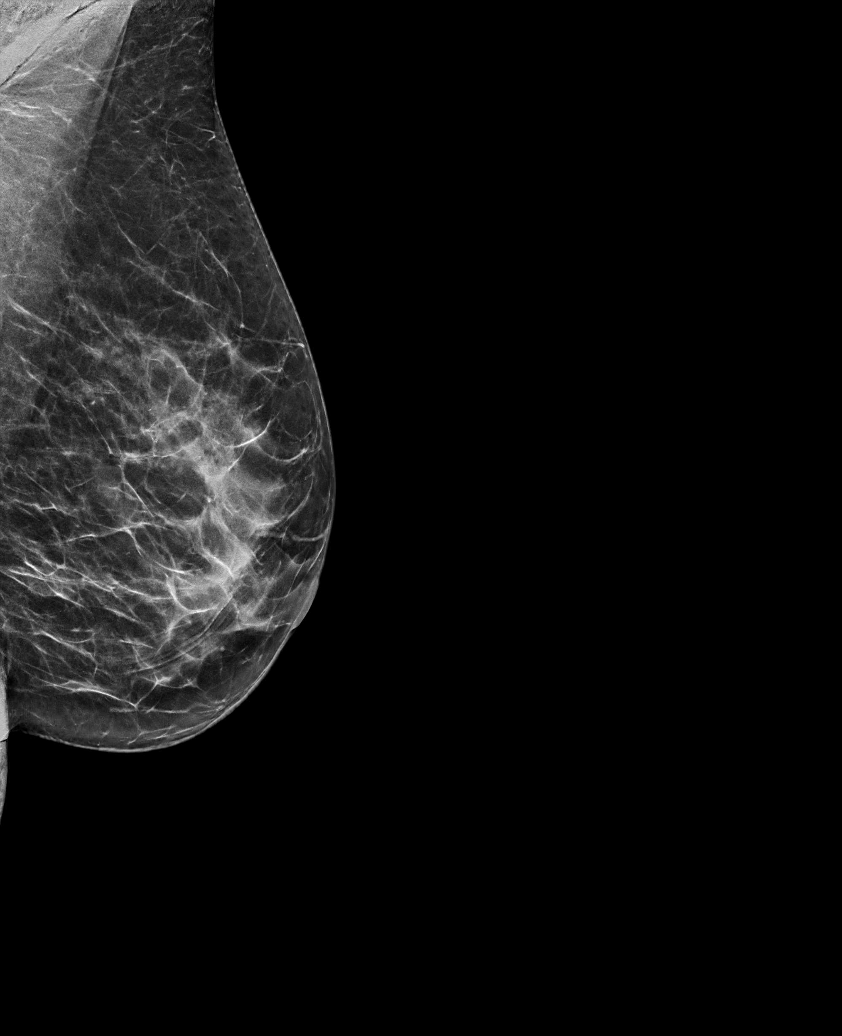

[R MLO synth-2D]
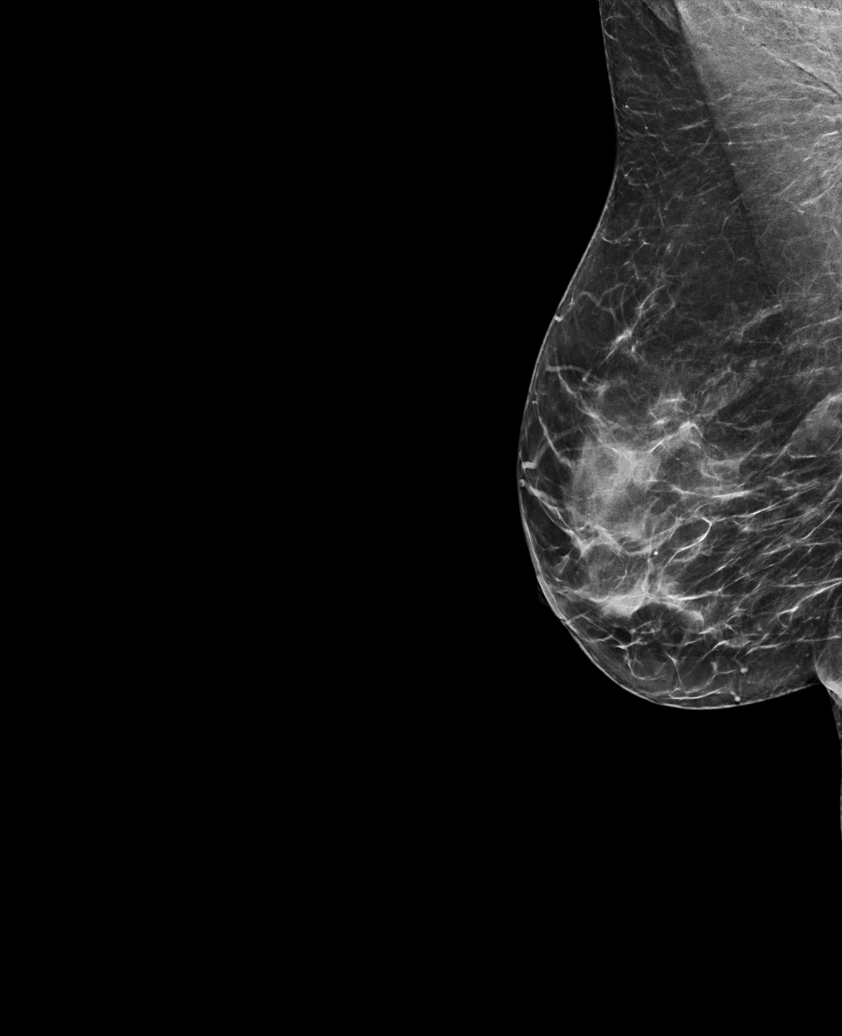

[L CC synth-2D]
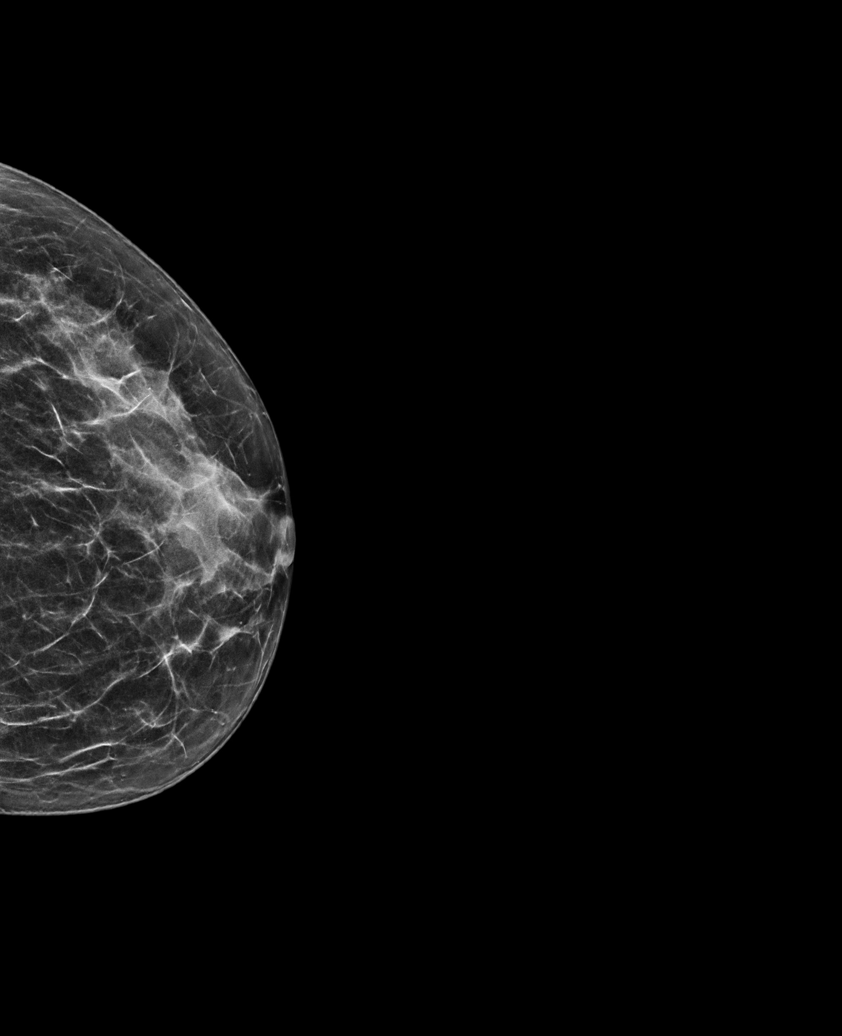

[L MLO tomo · tomo slice 35/68.0]
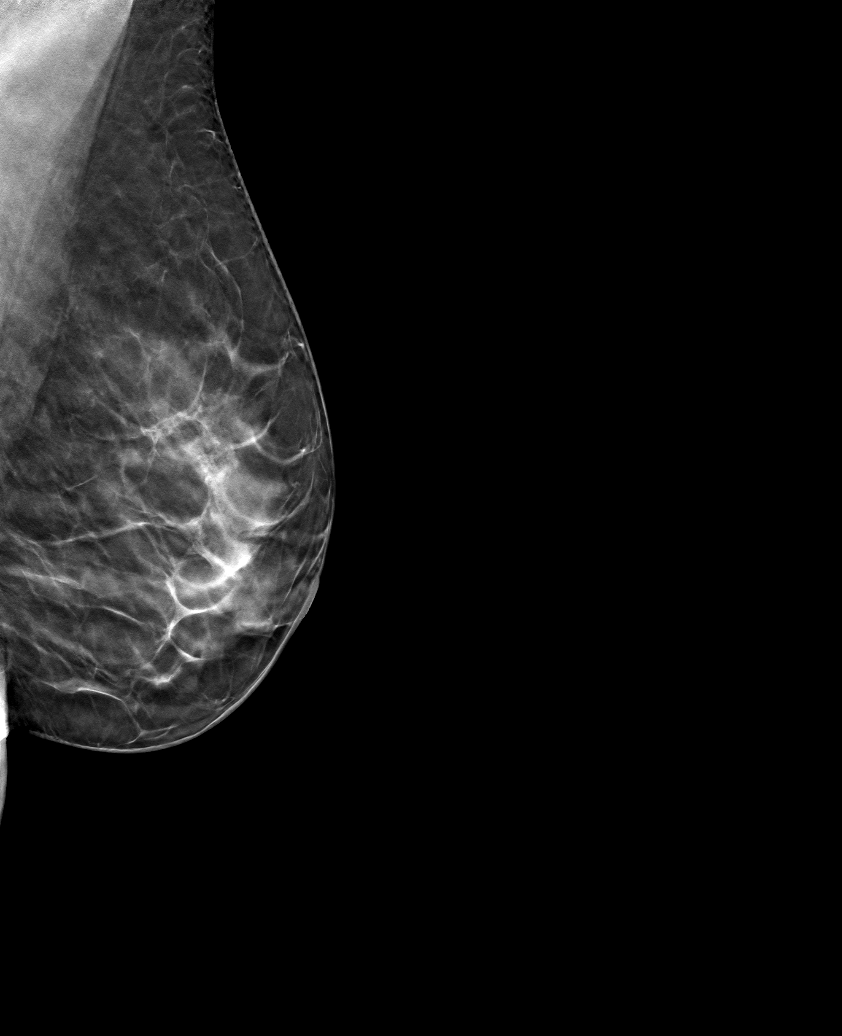

[6 of 30 positions shown; findings below may reference images not displayed]

ACR Breast Density Category c: The breast tissue is heterogeneously
dense, which may obscure small masses.
FINDINGS: There are no findings suspicious for malignancy.
IMPRESSION: No mammographic evidence of malignancy. A result letter of this
screening mammogram will be mailed directly to the patient.

RECOMMENDATION:
Screening mammogram in one year. (Code:Q3-W-BC3)

BI-RADS CATEGORY  1: Negative.
